# Patient Record
Sex: Female | Born: 1955 | Race: White | Hispanic: No | Marital: Married | Smoking: Never smoker
Health system: Southern US, Community
[De-identification: ages and names within clinical notes are randomized; demographics above are authoritative.]

## PROBLEM LIST (undated history)

## (undated) DIAGNOSIS — F209 Schizophrenia, unspecified: Secondary | ICD-10-CM

## (undated) DIAGNOSIS — I639 Cerebral infarction, unspecified: Secondary | ICD-10-CM

## (undated) DIAGNOSIS — I10 Essential (primary) hypertension: Secondary | ICD-10-CM

---

## 2000-08-14 ENCOUNTER — Other Ambulatory Visit: Admission: RE | Admit: 2000-08-14 | Discharge: 2000-08-14 | Payer: Self-pay | Admitting: Obstetrics and Gynecology

## 2000-08-20 ENCOUNTER — Encounter: Payer: Self-pay | Admitting: Obstetrics and Gynecology

## 2000-08-20 ENCOUNTER — Encounter: Admission: RE | Admit: 2000-08-20 | Discharge: 2000-08-20 | Payer: Self-pay | Admitting: Obstetrics and Gynecology

## 2000-08-20 ENCOUNTER — Other Ambulatory Visit: Admission: RE | Admit: 2000-08-20 | Discharge: 2000-08-20 | Payer: Self-pay | Admitting: Obstetrics and Gynecology

## 2000-11-21 ENCOUNTER — Inpatient Hospital Stay (HOSPITAL_COMMUNITY): Admission: EM | Admit: 2000-11-21 | Discharge: 2000-12-02 | Payer: Self-pay | Admitting: Psychiatry

## 2018-05-18 ENCOUNTER — Emergency Department (HOSPITAL_COMMUNITY): Payer: Medicare Other

## 2018-05-18 ENCOUNTER — Encounter: Payer: Self-pay | Admitting: Emergency Medicine

## 2018-05-18 ENCOUNTER — Inpatient Hospital Stay (HOSPITAL_COMMUNITY)
Admission: EM | Admit: 2018-05-18 | Discharge: 2018-05-27 | DRG: 092 | Disposition: A | Discharge status: Skilled Nursing Facility | Payer: Medicare Other | Attending: Internal Medicine | Admitting: Internal Medicine

## 2018-05-18 DIAGNOSIS — R402142 Coma scale, eyes open, spontaneous, at arrival to emergency department: Secondary | ICD-10-CM | POA: Diagnosis present

## 2018-05-18 DIAGNOSIS — R778 Other specified abnormalities of plasma proteins: Secondary | ICD-10-CM

## 2018-05-18 DIAGNOSIS — N179 Acute kidney failure, unspecified: Secondary | ICD-10-CM | POA: Diagnosis not present

## 2018-05-18 DIAGNOSIS — Z8249 Family history of ischemic heart disease and other diseases of the circulatory system: Secondary | ICD-10-CM | POA: Diagnosis not present

## 2018-05-18 DIAGNOSIS — R402252 Coma scale, best verbal response, oriented, at arrival to emergency department: Secondary | ICD-10-CM | POA: Diagnosis present

## 2018-05-18 DIAGNOSIS — I614 Nontraumatic intracerebral hemorrhage in cerebellum: Secondary | ICD-10-CM | POA: Diagnosis not present

## 2018-05-18 DIAGNOSIS — E860 Dehydration: Secondary | ICD-10-CM | POA: Diagnosis present

## 2018-05-18 DIAGNOSIS — R402362 Coma scale, best motor response, obeys commands, at arrival to emergency department: Secondary | ICD-10-CM | POA: Diagnosis present

## 2018-05-18 DIAGNOSIS — Z8673 Personal history of transient ischemic attack (TIA), and cerebral infarction without residual deficits: Secondary | ICD-10-CM | POA: Diagnosis not present

## 2018-05-18 DIAGNOSIS — R270 Ataxia, unspecified: Secondary | ICD-10-CM

## 2018-05-18 DIAGNOSIS — D1802 Hemangioma of intracranial structures: Secondary | ICD-10-CM | POA: Diagnosis present

## 2018-05-18 DIAGNOSIS — I619 Nontraumatic intracerebral hemorrhage, unspecified: Secondary | ICD-10-CM | POA: Diagnosis present

## 2018-05-18 DIAGNOSIS — R7989 Other specified abnormal findings of blood chemistry: Secondary | ICD-10-CM | POA: Diagnosis not present

## 2018-05-18 DIAGNOSIS — R29700 NIHSS score 0: Secondary | ICD-10-CM | POA: Diagnosis present

## 2018-05-18 DIAGNOSIS — I161 Hypertensive emergency: Secondary | ICD-10-CM | POA: Diagnosis present

## 2018-05-18 DIAGNOSIS — I160 Hypertensive urgency: Secondary | ICD-10-CM | POA: Diagnosis not present

## 2018-05-18 DIAGNOSIS — F209 Schizophrenia, unspecified: Secondary | ICD-10-CM | POA: Diagnosis present

## 2018-05-18 DIAGNOSIS — E876 Hypokalemia: Secondary | ICD-10-CM | POA: Diagnosis not present

## 2018-05-18 DIAGNOSIS — E785 Hyperlipidemia, unspecified: Secondary | ICD-10-CM | POA: Diagnosis present

## 2018-05-18 DIAGNOSIS — I10 Essential (primary) hypertension: Secondary | ICD-10-CM | POA: Diagnosis not present

## 2018-05-18 DIAGNOSIS — D72829 Elevated white blood cell count, unspecified: Secondary | ICD-10-CM | POA: Diagnosis not present

## 2018-05-18 DIAGNOSIS — Z008 Encounter for other general examination: Secondary | ICD-10-CM

## 2018-05-18 DIAGNOSIS — F99 Mental disorder, not otherwise specified: Secondary | ICD-10-CM | POA: Diagnosis not present

## 2018-05-18 DIAGNOSIS — I6523 Occlusion and stenosis of bilateral carotid arteries: Secondary | ICD-10-CM | POA: Diagnosis not present

## 2018-05-18 DIAGNOSIS — F29 Unspecified psychosis not due to a substance or known physiological condition: Secondary | ICD-10-CM | POA: Diagnosis not present

## 2018-05-18 HISTORY — DX: Schizophrenia, unspecified: F20.9

## 2018-05-18 LAB — COMPREHENSIVE METABOLIC PANEL
ALBUMIN: 4.4 g/dL (ref 3.5–5.0)
ALT: 18 U/L (ref 0–44)
ANION GAP: 9 (ref 5–15)
AST: 22 U/L (ref 15–41)
Alkaline Phosphatase: 99 U/L (ref 38–126)
BILIRUBIN TOTAL: 0.7 mg/dL (ref 0.3–1.2)
BUN: 14 mg/dL (ref 8–23)
CO2: 27 mmol/L (ref 22–32)
Calcium: 9.6 mg/dL (ref 8.9–10.3)
Chloride: 107 mmol/L (ref 98–111)
Creatinine, Ser: 1.04 mg/dL — ABNORMAL HIGH (ref 0.44–1.00)
GFR calc Af Amer: >60 mL/min (ref >60)
GFR calc non Af Amer: 56 mL/min — ABNORMAL LOW (ref >60)
GLUCOSE: 107 mg/dL — AB (ref 70–99)
POTASSIUM: 3.7 mmol/L (ref 3.5–5.1)
Sodium: 143 mmol/L (ref 135–145)
TOTAL PROTEIN: 8.2 g/dL — AB (ref 6.5–8.1)

## 2018-05-18 LAB — RAPID URINE DRUG SCREEN, HOSP PERFORMED
AMPHETAMINES: NOT DETECTED
BENZODIAZEPINES: NOT DETECTED
Barbiturates: NOT DETECTED
Cocaine: NOT DETECTED
Opiates: NOT DETECTED
Tetrahydrocannabinol: NOT DETECTED

## 2018-05-18 LAB — I-STAT TROPONIN, ED
TROPONIN I, POC: 0.16 ng/mL — AB (ref 0.00–0.08)
TROPONIN I, POC: 0.35 ng/mL — AB (ref 0.00–0.08)

## 2018-05-18 LAB — CBC
HCT: 49.3 % — ABNORMAL HIGH (ref 36.0–46.0)
Hemoglobin: 16.6 g/dL — ABNORMAL HIGH (ref 12.0–15.0)
MCH: 30.3 pg (ref 26.0–34.0)
MCHC: 33.7 g/dL (ref 30.0–36.0)
MCV: 90.1 fL (ref 80.0–100.0)
NRBC: 0 % (ref 0.0–0.2)
Platelets: 358 10*3/uL (ref 150–400)
RBC: 5.47 MIL/uL — ABNORMAL HIGH (ref 3.87–5.11)
RDW: 12.1 % (ref 11.5–15.5)
WBC: 10.9 10*3/uL — ABNORMAL HIGH (ref 4.0–10.5)

## 2018-05-18 LAB — URINALYSIS, ROUTINE W REFLEX MICROSCOPIC
Bilirubin Urine: NEGATIVE
Glucose, UA: NEGATIVE mg/dL
HGB URINE DIPSTICK: NEGATIVE
Ketones, ur: NEGATIVE mg/dL
Nitrite: NEGATIVE
Protein, ur: >=300 mg/dL — AB
SPECIFIC GRAVITY, URINE: 1.008 (ref 1.005–1.030)
pH: 8 (ref 5.0–8.0)

## 2018-05-18 LAB — APTT: aPTT: 31 seconds (ref 24–36)

## 2018-05-18 LAB — DIFFERENTIAL
Abs Immature Granulocytes: 0.03 10*3/uL (ref 0.00–0.07)
BASOS ABS: 0.1 10*3/uL (ref 0.0–0.1)
BASOS PCT: 1 %
EOS ABS: 0 10*3/uL (ref 0.0–0.5)
EOS PCT: 0 %
IMMATURE GRANULOCYTES: 0 %
Lymphocytes Relative: 14 %
Lymphs Abs: 1.5 10*3/uL (ref 0.7–4.0)
Monocytes Absolute: 0.4 10*3/uL (ref 0.1–1.0)
Monocytes Relative: 4 %
NEUTROS PCT: 81 %
Neutro Abs: 8.8 10*3/uL — ABNORMAL HIGH (ref 1.7–7.7)

## 2018-05-18 LAB — PROTIME-INR
INR: 0.87
Prothrombin Time: 11.8 seconds (ref 11.4–15.2)

## 2018-05-18 LAB — ETHANOL: Alcohol, Ethyl (B): <10 mg/dL (ref <10)

## 2018-05-18 MED ORDER — HYDRALAZINE HCL 20 MG/ML IJ SOLN
10.0000 mg | INTRAMUSCULAR | Status: DC | PRN
  Administered 2018-05-19 (×2): 20 mg via INTRAVENOUS
  Filled 2018-05-18 (×2): qty 1

## 2018-05-18 MED ORDER — SENNOSIDES-DOCUSATE SODIUM 8.6-50 MG PO TABS
1.0000 | ORAL_TABLET | Freq: Two times a day (BID) | ORAL | Status: DC
  Administered 2018-05-19 – 2018-05-27 (×15): 1 via ORAL
  Filled 2018-05-18 (×16): qty 1

## 2018-05-18 MED ORDER — LABETALOL HCL 5 MG/ML IV SOLN
10.0000 mg | INTRAVENOUS | Status: DC | PRN
  Administered 2018-05-18 – 2018-05-19 (×4): 10 mg via INTRAVENOUS
  Filled 2018-05-18 (×5): qty 4

## 2018-05-18 MED ORDER — ACETAMINOPHEN 160 MG/5ML PO SOLN: 650.0000 mg | ORAL | Status: DC | PRN

## 2018-05-18 MED ORDER — ACETAMINOPHEN 325 MG PO TABS
650.0000 mg | ORAL_TABLET | ORAL | Status: DC | PRN
  Administered 2018-05-24 – 2018-05-26 (×4): 650 mg via ORAL
  Filled 2018-05-18 (×4): qty 2

## 2018-05-18 MED ORDER — LABETALOL HCL 5 MG/ML IV SOLN
10.0000 mg | Freq: Once | INTRAVENOUS | Status: AC
  Administered 2018-05-18: 10 mg via INTRAVENOUS
  Filled 2018-05-18: qty 4

## 2018-05-18 MED ORDER — CLEVIDIPINE BUTYRATE 0.5 MG/ML IV EMUL
0.0000 mg/h | INTRAVENOUS | Status: DC
  Filled 2018-05-18: qty 50

## 2018-05-18 MED ORDER — STROKE: EARLY STAGES OF RECOVERY BOOK
Freq: Once | Status: AC
  Administered 2018-05-26: 15:00:00
  Filled 2018-05-18: qty 1

## 2018-05-18 MED ORDER — LORAZEPAM 2 MG/ML IJ SOLN
1.0000 mg | Freq: Once | INTRAMUSCULAR | Status: AC
  Administered 2018-05-18: 1 mg via INTRAVENOUS
  Filled 2018-05-18: qty 1

## 2018-05-18 MED ORDER — ACETAMINOPHEN 650 MG RE SUPP: 650.0000 mg | RECTAL | Status: DC | PRN

## 2018-05-18 NOTE — ED Notes (Signed)
Patient transported to MRI 

## 2018-05-18 NOTE — ED Notes (Signed)
RN made aware of elevated BP 

## 2018-05-18 NOTE — ED Provider Notes (Signed)
[facility] DEPT Provider Note   CSN: [id number] Arrival date & time: 05/18/18  1351     History   Chief Complaint No chief complaint on file.   HPI Linda Lynch is a 62 y.o. female with unknown PMH is brought to the ER for "help with everything"  Patient is oriented to self, place, time.  States that her speech sounds different, like she is on drugs but adamantly denies taking any medications or illicit drugs.  Additionally states that she has been having a hard time walking and has to hang onto the walls. States she is SOB when walking from her bedroom to the bathroom.  Other associated symptoms include headache, jaw pain, teeth pain.  She denies fever, CP, abdominal pain, vomiting, diarrhea, urinary symptoms.  Pt states she has a psychiatric history but cannot tell me what.  She has not taken medications in many years.  She denies SI, HI, auditory hallucinations but states she sees people that are not real walking around when she watches TV.   1530: Corroborating history obtained by TTS counselor who spoke to pt's elderly mother [phone].  Mother states she has noticed changes in speech and balance for the last 1 week.  She has staggering gait and appears to be unsteady.  She has seen pt going to the bathroom a lot.   HPI  Past Medical History:  Diagnosis Date  . Schizophrenia [facility])     Patient Active Problem List   Diagnosis Date Noted  . ICH (intracerebral hemorrhage) ([place]) 05/18/2018  . Hypertensive urgency 05/18/2018    History reviewed. No pertinent surgical history.   OB History   None      Home Medications    Prior to Admission medications   Not on File    Family History Family History  Problem Relation Age of Onset  . Hypertension Mother     Social History Social History   Tobacco Use  . Smoking status: Never Smoker  Substance Use Topics  . Alcohol use: Never    Frequency: Never  . Drug use: Never      Allergies   Patient has no known allergies.   Review of Systems Review of Systems  Respiratory: Positive for shortness of breath.   Neurological: Positive for headaches.       Speech changes, unsteady gait   All other systems reviewed and are negative.    Physical Exam Updated Vital Signs BP (!) 177/93 (BP Location: Left Arm)   Pulse 71   Temp 98.2 F (36.8 C) (Oral)   Resp 19   SpO2 96%   Physical Exam  Constitutional: She is oriented to person, place, and time. She appears well-developed and well-nourished. No distress.  NAD.  HENT:  Head: Normocephalic and atraumatic.  Right Ear: External ear normal.  Left Ear: External ear normal.  Nose: Nose normal.  MMM. Uvula slightly deviated to L without edema, erythema. Tonsils normal.   Eyes: Conjunctivae and EOM are normal.  Neck: Normal range of motion. Neck supple.  Cardiovascular: Normal rate, regular rhythm and normal heart sounds.  Pulmonary/Chest: Effort normal and breath sounds normal.  Abdominal: Soft. There is no tenderness.  Musculoskeletal: Normal range of motion. She exhibits no deformity.  Neurological: She is alert and oriented to person, place, and time. Gait abnormal.  Alert and oriented to self, place, time and event.  Speech is slowed without obvious dysarthria or aphasia. Strength 5/5 in upper and lower extremities  Sensation to light touch intact in bilateral face, upper and lower extremities Slow, unsteady gait, pt hangs on to walls. No pronator drift. No leg drop.  Normal finger-to-nose, finger tapping, heel to shin normal. CN II-XII grossly intact bilaterally.   Skin: Skin is warm and dry. Capillary refill takes less than 2 seconds.  Psychiatric: She has a normal mood and affect. Her behavior is normal. Judgment and thought content normal. Her speech is delayed.  Denies SI, HI, auditory hallucinations. States she sees "people walking around" that don't exist.   Nursing note and vitals  reviewed.    ED Treatments / Results  Labs (all labs ordered are listed, but only abnormal results are displayed) Labs Reviewed  CBC - Abnormal; Notable for the following components:      Result Value   WBC 10.9 (*)    RBC 5.47 (*)    Hemoglobin 16.6 (*)    HCT 49.3 (*)    All other components within normal limits  DIFFERENTIAL - Abnormal; Notable for the following components:   Neutro Abs 8.8 (*)    All other components within normal limits  COMPREHENSIVE METABOLIC PANEL - Abnormal; Notable for the following components:   Glucose, Bld 107 (*)    Creatinine, Ser 1.04 (*)    Total Protein 8.2 (*)    GFR calc non Af Amer 56 (*)    All other components within normal limits  URINALYSIS, ROUTINE W REFLEX MICROSCOPIC - Abnormal; Notable for the following components:   Color, Urine STRAW (*)    Protein, ur >=300 (*)    Leukocytes, UA SMALL (*)    Bacteria, UA RARE (*)    All other components within normal limits  I-STAT TROPONIN, ED - Abnormal; Notable for the following components:   Troponin i, poc 0.16 (*)    All other components within normal limits  I-STAT TROPONIN, ED - Abnormal; Notable for the following components:   Troponin i, poc 0.35 (*)    All other components within normal limits  ETHANOL  PROTIME-INR  APTT  RAPID URINE DRUG SCREEN, HOSP PERFORMED  TROPONIN I  TROPONIN I  TROPONIN I  HIV ANTIBODY (ROUTINE TESTING W REFLEX)    EKG EKG Interpretation  Date/Time:  Monday May 18 2018 17:02:07 EDT Ventricular Rate:  110 PR Interval:    QRS Duration: 141 QT Interval:  365 QTC Calculation: 494 R Axis:   72 Text Interpretation:  Sinus tachycardia Right bundle branch block agree. no old comparison Confirmed by Charlesetta Shanks [id number]) on 05/18/2018 5:13:58 PM   Radiology Dg Chest 1 View  Result Date: 05/18/2018 CLINICAL DATA:  Nodular density at the left lung base question nipple shadow. EXAM: CHEST  1 VIEW COMPARISON:  05/18/2018 chest radiograph at  1755 hours FINDINGS: Nipple markers have been placed and repeat imaging of the chest performed. Heart and mediastinal contours are within normal limits. No pulmonary consolidation, effusion or pneumothorax. No pulmonary edema. The nodular density seen previously at the left lung base is not visualized on current exam CT either having represented the left nipple shadow which now projects below the left hemidiaphragm or may have represented a summation of overlapping ribs and vessels. No discrete dominant mass is seen. No pulmonary consolidation or effusion. IMPRESSION: Nonvisualized nodular opacity previously seen at the left lung base laterally. Findings may have represented the left nipple shadow which now projects just below the left hemidiaphragm on current study or possibly a summation of overlapping ribs and vessels previously. Unfortunately,  to be absolutely certain that there is no pulmonary nodule being obscured by the diaphragm, CT of the chest without IV contrast would be the study of choice. Electronically Signed   By: Ashley Royalty M.D.   On: 05/18/2018 20:46   Dg Chest 2 View  Result Date: 05/18/2018 CLINICAL DATA:  Elevated troponin.  Shortness of breath. EXAM: CHEST - 2 VIEW COMPARISON:  None. FINDINGS: 1.2 cm nodular structure in the left lower chest is indeterminate. Otherwise, the lungs are clear. Heart and mediastinum are within normal limits. Trachea is midline. Negative for a pneumothorax. No large pleural effusions. No acute bone abnormality. IMPRESSION: No active cardiopulmonary disease. Indeterminate 1.2 cm nodular density in left lower chest. This could represent a nipple shadow. Recommend a follow-up chest radiograph with nipple markers. Electronically Signed   By: Markus Daft M.D.   On: 05/18/2018 18:07   Ct Head Wo Contrast  Result Date: 05/18/2018 CLINICAL DATA:  62 year old female with schizophrenia off medication. Initial encounter. EXAM: CT HEAD WITHOUT CONTRAST TECHNIQUE:  Contiguous axial images were obtained from the base of the skull through the vertex without intravenous contrast. COMPARISON:  None. FINDINGS: Brain: Small hyperdensity right cerebellum I suspect represents calcification rather than blood and may be related to remote insult such as remote small infarct. Cannot exclude underlying small cavernoma. 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 I suspect these are remote infarcts without CT evidence of large acute infarct. Prominent chronic microvascular changes Mild global atrophy. No intracranial mass lesion seen separate from above described findings. Vascular: No hyperdense vessel. Skull: Hyperostosis frontalis interna incidentally noted. Sinuses/Orbits: No acute orbital abnormality. Visualized paranasal sinuses are clear. Other: Mastoid air cells and middle ear cavities are clear. IMPRESSION: 1. Small hyperdensity right cerebellum I suspect represents calcification rather than blood and may be related to remote insult such as remote small infarct. Cannot exclude underlying small cavernoma. If there is any change in the patient's mental status, this can be re-assessed on follow-up to exclude the less likely consideration of intracranial hemorrhage. 2. 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 I suspect these are remote infarcts without CT evidence of large acute infarct. 3. Prominent chronic microvascular changes 4. Mild global atrophy. Electronically Signed   By: Genia Del M.D.   On: 05/18/2018 16:57   Mr Brain Wo Contrast  Result Date: 05/18/2018 CLINICAL DATA:  Initial evaluation for acute altered mental status, ataxia, slurred speech, headache. EXAM: MRI HEAD WITHOUT CONTRAST TECHNIQUE: Multiplanar, multiecho pulse sequences of the brain and surrounding structures were obtained without intravenous contrast. COMPARISON:  Prior CT from  earlier the same day. FINDINGS: Brain: Diffuse prominence of the CSF containing spaces compatible with generalized cerebral atrophy, advanced for age. Confluent T2/FLAIR hyperintensity within the periventricular and deep white matter both cerebral hemispheres, fairly advanced in nature, and most likely related to chronic microvascular ischemic change. Multiple remote lacunar infarcts present within the bilateral basal ganglia/corona radiata as well as the thalami and pons. Chronic hemosiderin staining noted about a right basal ganglia infarct. Small remote left cerebellar infarct noted. Few additional scattered chronic micro hemorrhages noted involving the supratentorial brain, likely related to hypertension and/or small vessel disease. 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 No associated edema to suggest acute hemorrhage. No abnormal foci of restricted diffusion to suggest acute or  subacute ischemia. Gray-white matter differentiation maintained. No acute intracranial hemorrhage. No other mass lesion. No midline shift or mass effect. No hydrocephalus. No extra-axial fluid collection. Pituitary gland within normal limits. Vascular: Major intracranial vascular flow voids are maintained. Skull and upper cervical spine: Craniocervical junction normal. Upper cervical spine within normal limits. Bone marrow signal intensity within normal limits. Scalp soft tissues unremarkable. Sinuses/Orbits: Globes and orbital soft tissues within normal limits. Mild mucosal thickening within the ethmoidal air cells. Paranasal sinuses are otherwise clear. No mastoid effusion. Other: None. IMPRESSION: 1. No acute intracranial abnormality. 2. Moderately advanced cerebral atrophy with chronic small vessel ischemic disease, with multiple remote lacunar infarcts involving the bilateral basal ganglia, thalami,  and pons. 3. 13 mm lesion with T2 hypointense rim involving the right cerebellar hemisphere, corresponding with previously noted hyperdensity. 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 No evidence associated acute or recent hemorrhage. 4. Few additional scattered chronic micro hemorrhages scattered throughout the brain, favored to be related to poorly controlled hypertension. Electronically Signed   By: Jeannine Boga M.D.   On: 05/18/2018 21:39    Procedures .Critical Care Performed by: Kinnie Feil, PA-C Authorized by: Kinnie Feil, PA-C   Critical care provider statement:    Critical care time (minutes):  45   Critical care was necessary to treat or prevent imminent or life-threatening deterioration of the following conditions: subacute ICH, elevated troponin.   Critical care was time spent personally by me on the following activities:  Discussions with consultants, evaluation of patient's response to treatment, examination of patient, ordering and performing treatments and interventions, ordering and review of laboratory studies, ordering and review of radiographic studies, pulse oximetry, re-evaluation of patient's condition, obtaining history from patient or surrogate and review of old charts   (including critical care time)  Medications Ordered in ED Medications  labetalol (NORMODYNE,TRANDATE) injection 10 mg (10 mg Intravenous Given 05/18/18 2227)  hydrALAZINE (APRESOLINE) injection 10-20 mg (has no administration in time range)   stroke: mapping our early stages of recovery book (has no administration in time range)  acetaminophen (TYLENOL) tablet 650 mg (has no administration in time range)    Or  acetaminophen (TYLENOL) solution 650 mg (has no administration in time range)    Or  acetaminophen (TYLENOL) suppository 650 mg (has no administration in time range)  senna-docusate (Senokot-S) tablet 1 tablet (has no administration in time range)  labetalol  (NORMODYNE,TRANDATE) injection 10 mg (10 mg Intravenous Given 05/18/18 1809)  LORazepam (ATIVAN) injection 1 mg (1 mg Intravenous Given 05/18/18 2038)     Initial Impression / Assessment and Plan / ED Course  I have reviewed the triage vital signs and the nursing notes.  Pertinent labs & imaging results that were available during my care of the patient were reviewed by me and considered in my medical decision making (see chart for details).  Clinical Course as of May 19 9  Mon May 18, 2018  1736 Troponin i, poc(!!): 0.16 [CG]  1737 BP(!): 195/120 [CG]  1737 BP(!): 180/100 [CG]  1737 Hemoglobin(!): 16.6 [CG]  1737 HCT(!): 49.3 [CG]  1738 IMPRESSION: 1. Small hyperdensity right cerebellum I suspect represents calcification rather than blood and may be related to remote insult such as remote small infarct. Cannot exclude underlying small cavernoma. If there is any change in the patient's mental status, this can be re-assessed on follow-up to exclude the less likely consideration of intracranial hemorrhage. 2. Bilateral basal ganglia, bilateral thalamic, left paracentral pontine and left  corona radiata infarcts. I suspect these are remote infarcts without CT evidence of large acute infarct. 3. Prominent chronic microvascular changes 4. Mild global atrophy.  CT HEAD WO CONTRAST [CG]  1800 Creatinine(!): 1.04 [CG]  2103 IMPRESSION: Nonvisualized nodular opacity previously seen at the left lung base laterally. Findings may have represented the left nipple shadow which now projects just below the left hemidiaphragm on current study or possibly a summation of overlapping ribs and vessels previously. Unfortunately, to be absolutely certain that there is no pulmonary nodule being obscured by the diaphragm, CT of the chest without IV contrast would be the study of choice.    DG Chest 1 View [CG]  2111 Troponin i, poc(!!): 0.35 [CG]  2121 No ICU, admit cone. BP 174 systolic 10 mg labetalol  prn q5 max 300 mg 24 hr, hold HR 65. Consider hydralazine.    [CG]  2201 IMPRESSION: 1. No acute intracranial abnormality. 2. Moderately advanced cerebral atrophy with chronic small vessel ischemic disease, with multiple remote lacunar infarcts involving the bilateral basal ganglia, thalami, and pons. 3. 13 mm lesion with T2 hypointense rim involving the right cerebellar hemisphere, corresponding with previously noted hyperdensity. 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 No evidence associated acute or recent hemorrhage. 4. Few additional scattered chronic micro hemorrhages scattered throughout the brain, favored to be related to poorly controlled hypertension.    MR BRAIN WO CONTRAST [CG]    Clinical Course User Index [CG] Kinnie Feil, PA-C    Pt and mother report unsteady gait, ataxia, slowed speech x 1 week in setting of headaches.  H/o schizophrenia (pt denies) w/o psychotropic medications for years.  Pt is a poor historian, mother is elderly and history is also limited.  Duration and chronicity is hard to determine. Exam shows slow, unsteady gait. Speech is slowed but not necessarily dysarthric or with aphasia.  Concern for tardive dyskinsia vs stroke vs psych. Pt was informally evaluated by TTS counselor who recommends medical work up since she has no active SI, HI will need to r/o medical etiologies.  Consider formal TTS assessment. Will initiate lab work, CT head, UDS, reassess.    1740: CT head with possible remote cerebellar infarct less likely intracranial bleed and other small remote infarcts.  Trop 0.16, pt did report exertional SOB with ambulating from bedroom to bathroom.  Given these, will obtain MRI brain to r/o infarct. Pending CXR, CMP. Hypertensive 195/120>180/100, will doste labetalol.   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  Trop trending upwards.  BP poorly controlled in ER. Spoke to neurology Leonel Ramsay)  who suspects these are subacute infarcts, pt does not need ICU, recommend SDU at [facility].  Spoke to Dr. Alcario Drought who will admit. Case discussed with Dr. Johnney Killian.   Final Clinical Impressions(s) / ED Diagnoses   Final diagnoses:  Ataxia  Elevated troponin    ED Discharge Orders    None       Kinnie Feil, PA-C 05/19/18 0010    Charlesetta Shanks, MD 05/21/18 [phone]

## 2018-05-18 NOTE — H&P (Signed)
History and Physical    Linda Lynch WNU:[id number] DOB: 1955/11/16 DOA: 05/18/2018  PCP: Patient, No Pcp Per  Patient coming from: Home  I have personally briefly reviewed patient's old medical records in [place]  Chief Complaint: Psychiatric evaluation  HPI: Linda Lynch is a 62 y.o. female with medical history significant of schizophrenia.  Patient initially triaged to psychiatric evaluation based on arrival by [facility] after family called and said she was schizophrenic and has been off medications.  Reportedly when patient got here she stated she was here for "personal reasons" and pointed to her genital area.  On further history taking with mother (who is 23 years old), it seems that patient has been "staggering" with unsteady gait for the past week.  The patient though reports that this has been ongoing for "15-20 years".  Patient denies headache.  Denies chest pain.  Does have some SOB.  Patient denies any PMH, states mother has HTN.  Patient doesn't seem like the most reliable historian.   ED Course: Serial trops were slightly positive x2 and slightly up trending.  BP is consistently very elevated with systolic's consistently above 190.  Alarmingly given this and the ataxia presentation the CT of her head suggests possible subacute cerebellar ICH.   Review of Systems: As per HPI otherwise 10 point review of systems negative.   Past Medical History:  Diagnosis Date  . Schizophrenia ([place])     History reviewed. No pertinent surgical history.   reports that she has never smoked. She does not have any smokeless tobacco history on file. She reports that she does not drink alcohol or use drugs.  No Known Allergies  Family History  Problem Relation Age of Onset  . Hypertension Mother      Prior to Admission medications   Not on File    Physical Exam: Vitals:   05/18/18 1356 05/18/18 1423 05/18/18 1801 05/18/18 2136  BP: (!) 195/120 (!) 180/100 (!) 182/112  (!) 195/104  Pulse: (!) 118 100 (!) 109 97  Resp: 14 16 (!) 21 20  Temp: 98.5 F (36.9 C) 98 F (36.7 C)  98.2 F (36.8 C)  TempSrc: Oral Oral  Oral  SpO2: 100% 100% 97% 94%    Constitutional: NAD, calm, comfortable Eyes: PERRL, lids and conjunctivae normal ENMT: Mucous membranes are moist. Posterior pharynx clear of any exudate or lesions.Normal dentition.  Neck: normal, supple, no masses, no thyromegaly Respiratory: clear to auscultation bilaterally, no wheezing, no crackles. Normal respiratory effort. No accessory muscle use.  Cardiovascular: Regular rate and rhythm, no murmurs / rubs / gallops. No extremity edema. 2+ pedal pulses. No carotid bruits.  Abdomen: no tenderness, no masses palpated. No hepatosplenomegaly. Bowel sounds positive.  Musculoskeletal: no clubbing / cyanosis. No joint deformity upper and lower extremities. Good ROM, no contractures. Normal muscle tone.  Skin: no rashes, lesions, ulcers. No induration Neurologic: CN 2-12 grossly intact. Sensation intact, DTR normal. Strength 5/5 in all 4. Gait unsteady. Psychiatric: Denies SI, HI, states she sees "people walking around" that don't exist.   Labs on Admission: I have personally reviewed following labs and imaging studies  CBC: Recent Labs  Lab 05/18/18 1716  WBC 10.9*  NEUTROABS 8.8*  HGB 16.6*  HCT 49.3*  MCV 90.1  PLT 034   Basic Metabolic Panel: Recent Labs  Lab 05/18/18 1716  NA 143  K 3.7  CL 107  CO2 27  GLUCOSE 107*  BUN 14  CREATININE 1.04*  CALCIUM 9.6  GFR: CrCl cannot be calculated (Unknown ideal weight.). Liver Function Tests: Recent Labs  Lab 05/18/18 1716  AST 22  ALT 18  ALKPHOS 99  BILITOT 0.7  PROT 8.2*  ALBUMIN 4.4   No results for input(s): LIPASE, AMYLASE in the last 168 hours. No results for input(s): AMMONIA in the last 168 hours. Coagulation Profile: Recent Labs  Lab 05/18/18 1716  INR 0.87   Cardiac Enzymes: No results for input(s): CKTOTAL, CKMB,  CKMBINDEX, TROPONINI in the last 168 hours. BNP (last 3 results) No results for input(s): PROBNP in the last 8760 hours. HbA1C: No results for input(s): HGBA1C in the last 72 hours. CBG: No results for input(s): GLUCAP in the last 168 hours. Lipid Profile: No results for input(s): CHOL, HDL, LDLCALC, TRIG, CHOLHDL, LDLDIRECT in the last 72 hours. Thyroid Function Tests: No results for input(s): TSH, T4TOTAL, FREET4, T3FREE, THYROIDAB in the last 72 hours. Anemia Panel: No results for input(s): VITAMINB12, FOLATE, FERRITIN, TIBC, IRON, RETICCTPCT in the last 72 hours. Urine analysis:    Component Value Date/Time   COLORURINE STRAW (A) 05/18/2018 1613   APPEARANCEUR CLEAR 05/18/2018 1613   LABSPEC 1.008 05/18/2018 1613   PHURINE 8.0 05/18/2018 1613   GLUCOSEU NEGATIVE 05/18/2018 1613   HGBUR NEGATIVE 05/18/2018 1613   BILIRUBINUR NEGATIVE 05/18/2018 1613   KETONESUR NEGATIVE 05/18/2018 1613   PROTEINUR >=300 (A) 05/18/2018 1613   NITRITE NEGATIVE 05/18/2018 1613   LEUKOCYTESUR SMALL (A) 05/18/2018 1613    Radiological Exams on Admission: Dg Chest 1 View  Result Date: 05/18/2018 CLINICAL DATA:  Nodular density at the left lung base question nipple shadow. EXAM: CHEST  1 VIEW COMPARISON:  05/18/2018 chest radiograph at 1755 hours FINDINGS: Nipple markers have been placed and repeat imaging of the chest performed. Heart and mediastinal contours are within normal limits. No pulmonary consolidation, effusion or pneumothorax. No pulmonary edema. The nodular density seen previously at the left lung base is not visualized on current exam CT either having represented the left nipple shadow which now projects below the left hemidiaphragm or may have represented a summation of overlapping ribs and vessels. No discrete dominant mass is seen. No pulmonary consolidation or effusion. IMPRESSION: Nonvisualized nodular opacity previously seen at the left lung base laterally. Findings may have  represented the left nipple shadow which now projects just below the left hemidiaphragm on current study or possibly a summation of overlapping ribs and vessels previously. Unfortunately, to be absolutely certain that there is no pulmonary nodule being obscured by the diaphragm, CT of the chest without IV contrast would be the study of choice. Electronically Signed   By: Ashley Royalty M.D.   On: 05/18/2018 20:46   Dg Chest 2 View  Result Date: 05/18/2018 CLINICAL DATA:  Elevated troponin.  Shortness of breath. EXAM: CHEST - 2 VIEW COMPARISON:  None. FINDINGS: 1.2 cm nodular structure in the left lower chest is indeterminate. Otherwise, the lungs are clear. Heart and mediastinum are within normal limits. Trachea is midline. Negative for a pneumothorax. No large pleural effusions. No acute bone abnormality. IMPRESSION: No active cardiopulmonary disease. Indeterminate 1.2 cm nodular density in left lower chest. This could represent a nipple shadow. Recommend a follow-up chest radiograph with nipple markers. Electronically Signed   By: Markus Daft M.D.   On: 05/18/2018 18:07   Ct Head Wo Contrast  Result Date: 05/18/2018 CLINICAL DATA:  62 year old female with schizophrenia off medication. Initial encounter. EXAM: CT HEAD WITHOUT CONTRAST TECHNIQUE: Contiguous axial images were obtained  from the base of the skull through the vertex without intravenous contrast. COMPARISON:  None. FINDINGS: Brain: Small hyperdensity right cerebellum I suspect represents calcification rather than blood and may be related to remote insult such as remote small infarct. Cannot exclude underlying small cavernoma. 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 I suspect these are remote infarcts without CT evidence of large acute infarct. Prominent chronic microvascular changes Mild global atrophy. No intracranial mass lesion seen separate from above described findings. Vascular:  No hyperdense vessel. Skull: Hyperostosis frontalis interna incidentally noted. Sinuses/Orbits: No acute orbital abnormality. Visualized paranasal sinuses are clear. Other: Mastoid air cells and middle ear cavities are clear. IMPRESSION: 1. Small hyperdensity right cerebellum I suspect represents calcification rather than blood and may be related to remote insult such as remote small infarct. Cannot exclude underlying small cavernoma. If there is any change in the patient's mental status, this can be re-assessed on follow-up to exclude the less likely consideration of intracranial hemorrhage. 2. 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 I suspect these are remote infarcts without CT evidence of large acute infarct. 3. Prominent chronic microvascular changes 4. Mild global atrophy. Electronically Signed   By: Genia Del M.D.   On: 05/18/2018 16:57   Mr Brain Wo Contrast  Result Date: 05/18/2018 CLINICAL DATA:  Initial evaluation for acute altered mental status, ataxia, slurred speech, headache. EXAM: MRI HEAD WITHOUT CONTRAST TECHNIQUE: Multiplanar, multiecho pulse sequences of the brain and surrounding structures were obtained without intravenous contrast. COMPARISON:  Prior CT from earlier the same day. FINDINGS: Brain: Diffuse prominence of the CSF containing spaces compatible with generalized cerebral atrophy, advanced for age. Confluent T2/FLAIR hyperintensity within the periventricular and deep white matter both cerebral hemispheres, fairly advanced in nature, and most likely related to chronic microvascular ischemic change. Multiple remote lacunar infarcts present within the bilateral basal ganglia/corona radiata as well as the thalami and pons. Chronic hemosiderin staining noted about a right basal ganglia infarct. Small remote left cerebellar infarct noted. Few additional scattered chronic micro hemorrhages noted involving the  supratentorial brain, likely related to hypertension and/or small vessel disease. 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 No associated edema to suggest acute hemorrhage. No abnormal foci of restricted diffusion to suggest acute or subacute ischemia. Gray-white matter differentiation maintained. No acute intracranial hemorrhage. No other mass lesion. No midline shift or mass effect. No hydrocephalus. No extra-axial fluid collection. Pituitary gland within normal limits. Vascular: Major intracranial vascular flow voids are maintained. Skull and upper cervical spine: Craniocervical junction normal. Upper cervical spine within normal limits. Bone marrow signal intensity within normal limits. Scalp soft tissues unremarkable. Sinuses/Orbits: Globes and orbital soft tissues within normal limits. Mild mucosal thickening within the ethmoidal air cells. Paranasal sinuses are otherwise clear. No mastoid effusion. Other: None. IMPRESSION: 1. No acute intracranial abnormality. 2. Moderately advanced cerebral atrophy with chronic small vessel ischemic disease, with multiple remote lacunar infarcts involving the bilateral basal ganglia, thalami, and pons. 3. 13 mm lesion with T2 hypointense rim involving the right cerebellar hemisphere, corresponding with previously noted hyperdensity. 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 No evidence associated acute or recent hemorrhage. 4. Few additional scattered chronic micro hemorrhages scattered throughout the brain, favored to be related to poorly controlled hypertension. Electronically Signed   By: Pincus Badder.D.  On: 05/18/2018 21:39    EKG: Independently reviewed.  Assessment/Plan Principal Problem:   ICH (intracerebral hemorrhage) (HCC) Active Problems:   Hypertensive urgency    1. Question subacute ICH - 62 yo F here  with AMS (possibly psychiatric in nature), gait ataxia (possibly chronic).  Despite the questionable HPI she does have profound HTN today with SBPs consistently in the 190s, serial trops of 0.16 and 0.35, and CT scan shows questionable cerebellar hemorrhage. 1. Per Dr. Leonel Ramsay 1. Likely subacute ICH 2. No need for clividipine gtt 3. Instead goal SBP < 160 4. Using labetalol 5. Hydralazine if that fails 2. Admit to SDU at [facility] 3. ICH pathway 4. Serial trops 5. Tele monitor 2. Schizophrenia off meds by history - 1. Psych consult once medically cleared  DVT prophylaxis: SCDs Code Status: Full Family Communication: No family in room Disposition Plan: TBD Consults called: Neuro hospitalist Admission status: Admit to inpatient  Severity of Illness: The appropriate patient status for this patient is INPATIENT. Inpatient status is judged to be reasonable and necessary in order to provide the required intensity of service to ensure the patient's safety. The patient's presenting symptoms, physical exam findings, and initial radiographic and laboratory data in the context of their chronic comorbidities is felt to place them at high risk for further clinical deterioration. Furthermore, it is not anticipated that the patient will be medically stable for discharge from the hospital within 2 midnights of admission. The following factors support the patient status of inpatient.   " The patient's presenting symptoms include Gait ataxia, AMS. " The worrisome physical exam findings include Gait ataxia, severe hypertension with SBP consistently > 190 " The initial radiographic and laboratory data are worrisome because of Possible subacute ICH in cerebellum, elevated and up trending troponin. " The chronic co-morbidities include history of schizophrenia.   * I certify that at the point of admission it is my clinical judgment that the patient will require inpatient hospital care spanning beyond 2 midnights  from the point of admission due to high intensity of service, high risk for further deterioration and high frequency of surveillance required.Etta Quill DO [facility] Pager [phone] Only works nights!  If 7AM-7PM, please contact the primary day team physician taking care of patient  [email or web address] Password [email or web address]  05/18/2018, 10:36 PM

## 2018-05-18 NOTE — ED Notes (Signed)
Alcario Drought MD Hospitalist is at bedside

## 2018-05-18 NOTE — ED Provider Notes (Signed)
Medical screening examination/treatment/procedure(s) were conducted as a shared visit with non-physician practitioner(s) and myself.  I personally evaluated the patient during the encounter.  EKG Interpretation  Date/Time:  Monday May 18 2018 17:02:07 EDT Ventricular Rate:  110 PR Interval:    QRS Duration: 141 QT Interval:  365 QTC Calculation: 494 R Axis:   72 Text Interpretation:  Sinus tachycardia Right bundle branch block agree. no old comparison Confirmed by Charlesetta Shanks [phone]) on 05/18/2018 8:67:54 PM  Has complicated presentation.  Patient initially triaged to psychiatric evaluation based on arrival by [facility] after family called and said she was schizophrenic and has been off medications.  Reportedly when patient got here she stated she was here for "personal reasons" and pointed to her genital area.  After further evaluation and identification of significant hypertension, patient evaluation was focused on medical etiologies.  Patient is a significantly difficult historian.  She tells me her speech has been kind of slurred and has not sounded right, she reports this is been in for "years".  She also reports that her balance has been off for a very long time and she has been trying to use her mother's walker.  Again she identified this is being a very long time.  When asked if she had a headache she reports that she "used to" have headaches but does not have any now.  History obtained from a phone call by TTS counselor to the patient's elderly mother, indicates that the symptoms have become pronounced over the past week.  She does identify shortness of breath is a complaint.  She is states now just walking short distances her house makes her very short of breath.  She denies chest pain.  Patient is alert and pleasant.  She is interactive.  Her speech has a slightly slurred quality although this seems potentially a chronic speech pattern.  Pupils equally round reactive to light  extraocular motions intact.  Heart regular no rub murmur gallop.  Lungs are clear.  Abdomen soft nontender.  No lower extremity edema.  Calves soft and nontender.  Finger-nose exam normal.  No pronator drift.  Patient was not ambulated.   I agree with plan of management.    Charlesetta Shanks, MD 05/30/18 [facility]

## 2018-05-18 NOTE — ED Triage Notes (Addendum)
Pt arrived voluntarily with [facility] after family called and said she had schizophrenia and has been off her meds..  Pt states she is here for "personal reasons" as she points to her vagina.

## 2018-05-18 NOTE — ED Notes (Signed)
Bed: WA02 Expected date:  Expected time:  Means of arrival:  Comments: TCU 30

## 2018-05-19 ENCOUNTER — Inpatient Hospital Stay (HOSPITAL_COMMUNITY): Payer: Medicare Other

## 2018-05-19 DIAGNOSIS — I614 Nontraumatic intracerebral hemorrhage in cerebellum: Secondary | ICD-10-CM

## 2018-05-19 DIAGNOSIS — R270 Ataxia, unspecified: Secondary | ICD-10-CM

## 2018-05-19 DIAGNOSIS — I5030 Unspecified diastolic (congestive) heart failure: Secondary | ICD-10-CM

## 2018-05-19 LAB — ECHOCARDIOGRAM COMPLETE
Height: 64 in
Weight: 2726.65 oz

## 2018-05-19 LAB — LIPID PANEL
CHOL/HDL RATIO: 4.5 ratio
Cholesterol: 195 mg/dL (ref 0–200)
HDL: 43 mg/dL (ref >40)
LDL Cholesterol: 136 mg/dL — ABNORMAL HIGH (ref 0–99)
TRIGLYCERIDES: 81 mg/dL (ref <150)
VLDL: 16 mg/dL (ref 0–40)

## 2018-05-19 LAB — MRSA PCR SCREENING: MRSA by PCR: NEGATIVE

## 2018-05-19 LAB — HEMOGLOBIN A1C
Hgb A1c MFr Bld: 5 % (ref 4.8–5.6)
Mean Plasma Glucose: 96.8 mg/dL

## 2018-05-19 MED ORDER — INFLUENZA VAC SPLIT QUAD 0.5 ML IM SUSY: 0.5000 mL | PREFILLED_SYRINGE | INTRAMUSCULAR | Status: DC

## 2018-05-19 MED ORDER — ASPIRIN EC 81 MG PO TBEC
81.0000 mg | DELAYED_RELEASE_TABLET | Freq: Every day | ORAL | Status: DC
  Administered 2018-05-19 – 2018-05-26 (×7): 81 mg via ORAL
  Filled 2018-05-19 (×9): qty 1

## 2018-05-19 MED ORDER — AMLODIPINE BESYLATE 10 MG PO TABS
10.0000 mg | ORAL_TABLET | Freq: Every day | ORAL | Status: DC
  Administered 2018-05-19 – 2018-05-27 (×8): 10 mg via ORAL
  Filled 2018-05-19 (×9): qty 1

## 2018-05-19 MED ORDER — HYDROCHLOROTHIAZIDE 25 MG PO TABS
25.0000 mg | ORAL_TABLET | Freq: Every day | ORAL | Status: DC
  Administered 2018-05-19: 25 mg via ORAL
  Filled 2018-05-19 (×2): qty 1

## 2018-05-19 MED ORDER — ONDANSETRON HCL 4 MG/2ML IJ SOLN
4.0000 mg | Freq: Four times a day (QID) | INTRAMUSCULAR | Status: DC | PRN
  Administered 2018-05-19: 4 mg via INTRAVENOUS
  Filled 2018-05-19: qty 2

## 2018-05-19 MED ORDER — HYDRALAZINE HCL 20 MG/ML IJ SOLN
10.0000 mg | INTRAMUSCULAR | Status: DC | PRN
  Administered 2018-05-20: 20 mg via INTRAVENOUS
  Administered 2018-05-20: 10 mg via INTRAVENOUS
  Administered 2018-05-20: 20 mg via INTRAVENOUS
  Administered 2018-05-20 – 2018-05-24 (×3): 10 mg via INTRAVENOUS
  Administered 2018-05-25: 20 mg via INTRAVENOUS
  Administered 2018-05-26: 10 mg via INTRAVENOUS
  Filled 2018-05-19 (×7): qty 1

## 2018-05-19 MED ORDER — IOPAMIDOL (ISOVUE-370) INJECTION 76%
INTRAVENOUS | Status: AC
  Filled 2018-05-19: qty 50

## 2018-05-19 MED ORDER — ATORVASTATIN CALCIUM 10 MG PO TABS
20.0000 mg | ORAL_TABLET | Freq: Every day | ORAL | Status: DC
  Administered 2018-05-21 – 2018-05-26 (×6): 20 mg via ORAL
  Filled 2018-05-19 (×7): qty 2

## 2018-05-19 NOTE — Progress Notes (Signed)
PROGRESS NOTE    Linda Lynch  IZT:[id number] DOB: 05-Sep-1955 DOA: 05/18/2018 PCP: Patient, No Pcp Per   Brief Narrative:  62 year old with past medical history relevant for psychiatric illness who came in with nonspecific complaints such as "help with everything" with reports from patient's elderly mother about problems with speech and balance and found to have hypertensive emergency with elevated troponins.  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   Assessment & Plan:   Principal Problem:   ICH (intracerebral hemorrhage) (HCC) Active Problems:   Hypertensive urgency   #) Gait instability/dysarthria: At this time it is unclear what symptoms she was having is patient's mother is not picking up the phone and the patient herself is an extremely poor historian.  She is extremely unreliable often going off on tangents and her review of systems is pan positive.  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 -Physical therapy consult  #) HHypertensive emergency/elevated troponin: Patient was noted to be quite hypertensive with systolics in the 382N.  Unclear etiology however this is now being controlled with PRN IV boluses as well as now oral medications.  She has not been on any antihypertensives at home.  She does report a 10-year history of chest pain that is unchanged.  It is not clear that she is symptomatic from this elevated troponin.  Regardless her EKG does not show any evidence of ACS. -Telemetry -Trend troponins -Echo ordered -We will hold on IV heparin at this time -Continue PRN  hydralazine for systolics greater than 053 -Start amlodipine 10 mg daily - Start HCTZ 25 mg daily  #) Lacunar  infarcts/microhemorrhages: Noted on CT scan scan and MRI.  These all appear to be old.  She has no sequelae from this that are evident.  There are likely secondary to uncontrolled hypertension. -Neurology has signed off -Continue aspirin 81 mg e- tight blood pressure control per above -Pending carotid ultrasound per neurology  #) Psychiatric history: Patient apparently has a psychiatric history however no confided it can be found to discuss the situation with her right now. 9 psychiatry consult  Fluids: Tolerating p.o. Electrodes: Monitor and supplement   nutrition: Regular diet  Prophylaxis: SCDs  Disposition: Pending stabilization of blood pressure and echo  Full code      Consultants:   Neurology  Procedures:   05/19/2017 echo: Pending  10 15,019 carotid ultrasound: Pending  Antimicrobials:   None   Subjective: Patient reports that she has had chest pain that is exertional for approximately 10 years.  She also reports syncope/presyncope, shortness of breath orthopnea, lower extremity edema, abdominal pain, cough, congestion, focal weakness that alternates, dysarthria.  Essentially her review of systems is pan positive.  She denies any SI or HI at this time.  Objective: Vitals:   05/19/18 0830 05/19/18 0847 05/19/18 0900 05/19/18 1000  BP: (!) 183/165 (!) 159/79 (!) 142/66 (!) 142/77  Pulse: 97  83 95  Resp: 18 20 (!) 22 20  Temp:      TempSrc:      SpO2: 98%  98% 97%  Weight:      Height:        Intake/Output Summary (Last 24 hours) at 05/19/2018 1201 Last data filed at 05/19/2018 1100 Gross  per 24 hour  Intake 600 ml  Output 450 ml  Net 150 ml   Filed Weights   05/19/18 0600  Weight: 77.3 kg    Examination:  General exam: Appears calm and comfortable  Respiratory system: Clear to auscultation. Respiratory effort normal. Cardiovascular system: Regular rate and rhythm, no murmurs. Gastrointestinal system: Abdomen is nondistended, soft and  nontender. No organomegaly or masses felt. Normal bowel sounds heard. Central nervous system: Alert but not oriented to situation, grossly intact, moving all extremities, cranial nerves II through XII intact, no dysdiadochokinesia. Extremities: Trace lower extremity edema Skin: No rashes over visible skin Psychiatry: Judgment and insight are impaired, patient is having delusions, unreliable narrator    Data Reviewed: I have personally reviewed following labs and imaging studies  CBC: Recent Labs  Lab 05/18/18 1716  WBC 10.9*  NEUTROABS 8.8*  HGB 16.6*  HCT 49.3*  MCV 90.1  PLT 347   Basic Metabolic Panel: Recent Labs  Lab 05/18/18 1716  NA 143  K 3.7  CL 107  CO2 27  GLUCOSE 107*  BUN 14  CREATININE 1.04*  CALCIUM 9.6   GFR: Estimated Creatinine Clearance: 55 mL/min (A) (by C-G formula based on SCr of 1.04 mg/dL (H)). Liver Function Tests: Recent Labs  Lab 05/18/18 1716  AST 22  ALT 18  ALKPHOS 99  BILITOT 0.7  PROT 8.2*  ALBUMIN 4.4   No results for input(s): LIPASE, AMYLASE in the last 168 hours. No results for input(s): AMMONIA in the last 168 hours. Coagulation Profile: Recent Labs  Lab 05/18/18 1716  INR 0.87   Cardiac Enzymes: No results for input(s): CKTOTAL, CKMB, CKMBINDEX, TROPONINI in the last 168 hours. BNP (last 3 results) No results for input(s): PROBNP in the last 8760 hours. HbA1C: No results for input(s): HGBA1C in the last 72 hours. CBG: No results for input(s): GLUCAP in the last 168 hours. Lipid Profile: No results for input(s): CHOL, HDL, LDLCALC, TRIG, CHOLHDL, LDLDIRECT in the last 72 hours. Thyroid Function Tests: No results for input(s): TSH, T4TOTAL, FREET4, T3FREE, THYROIDAB in the last 72 hours. Anemia Panel: No results for input(s): VITAMINB12, FOLATE, FERRITIN, TIBC, IRON, RETICCTPCT in the last 72 hours. Sepsis Labs: No results for input(s): PROCALCITON, LATICACIDVEN in the last 168 hours.  Recent Results (from the  past 240 hour(s))  MRSA PCR Screening     Status: None   Collection Time: 05/19/18  6:07 AM  Result Value Ref Range Status   MRSA by PCR NEGATIVE NEGATIVE Final    Comment:        The GeneXpert MRSA Assay (FDA approved for NASAL specimens only), is one component of a comprehensive MRSA colonization surveillance program. It is not intended to diagnose MRSA infection nor to guide or monitor treatment for MRSA infections. Performed at [facility], [place] [address], [place], [place] [address]          Radiology Studies: Dg Chest 1 View  Result Date: 05/18/2018 CLINICAL DATA:  Nodular density at the left lung base question nipple shadow. EXAM: CHEST  1 VIEW COMPARISON:  05/18/2018 chest radiograph at 1755 hours FINDINGS: Nipple markers have been placed and repeat imaging of the chest performed. Heart and mediastinal contours are within normal limits. No pulmonary consolidation, effusion or pneumothorax. No pulmonary edema. The nodular density seen previously at the left lung base is not visualized on current exam CT either having represented the left nipple shadow which now projects below the left hemidiaphragm or may  have represented a summation of overlapping ribs and vessels. No discrete dominant mass is seen. No pulmonary consolidation or effusion. IMPRESSION: Nonvisualized nodular opacity previously seen at the left lung base laterally. Findings may have represented the left nipple shadow which now projects just below the left hemidiaphragm on current study or possibly a summation of overlapping ribs and vessels previously. Unfortunately, to be absolutely certain that there is no pulmonary nodule being obscured by the diaphragm, CT of the chest without IV contrast would be the study of choice. Electronically Signed   By: Ashley Royalty M.D.   On: 05/18/2018 20:46   Dg Chest 2 View  Result Date: 05/18/2018 CLINICAL DATA:  Elevated troponin.  Shortness of breath. EXAM: CHEST - 2 VIEW  COMPARISON:  None. FINDINGS: 1.2 cm nodular structure in the left lower chest is indeterminate. Otherwise, the lungs are clear. Heart and mediastinum are within normal limits. Trachea is midline. Negative for a pneumothorax. No large pleural effusions. No acute bone abnormality. IMPRESSION: No active cardiopulmonary disease. Indeterminate 1.2 cm nodular density in left lower chest. This could represent a nipple shadow. Recommend a follow-up chest radiograph with nipple markers. Electronically Signed   By: Markus Daft M.D.   On: 05/18/2018 18:07   Ct Head Wo Contrast  Result Date: 05/18/2018 CLINICAL DATA:  62 year old female with schizophrenia off medication. Initial encounter. EXAM: CT HEAD WITHOUT CONTRAST TECHNIQUE: Contiguous axial images were obtained from the base of the skull through the vertex without intravenous contrast. COMPARISON:  None. FINDINGS: Brain: Small hyperdensity right cerebellum I suspect represents calcification rather than blood and may be related to remote insult such as remote small infarct. Cannot exclude underlying small cavernoma. 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 I suspect these are remote infarcts without CT evidence of large acute infarct. Prominent chronic microvascular changes Mild global atrophy. No intracranial mass lesion seen separate from above described findings. Vascular: No hyperdense vessel. Skull: Hyperostosis frontalis interna incidentally noted. Sinuses/Orbits: No acute orbital abnormality. Visualized paranasal sinuses are clear. Other: Mastoid air cells and middle ear cavities are clear. IMPRESSION: 1. Small hyperdensity right cerebellum I suspect represents calcification rather than blood and may be related to remote insult such as remote small infarct. Cannot exclude underlying small cavernoma. If there is any change in the patient's mental status, this can be re-assessed on follow-up to  exclude the less likely consideration of intracranial hemorrhage. 2. 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 I suspect these are remote infarcts without CT evidence of large acute infarct. 3. Prominent chronic microvascular changes 4. Mild global atrophy. Electronically Signed   By: Genia Del M.D.   On: 05/18/2018 16:57   Mr Brain Wo Contrast  Result Date: 05/18/2018 CLINICAL DATA:  Initial evaluation for acute altered mental status, ataxia, slurred speech, headache. EXAM: MRI HEAD WITHOUT CONTRAST TECHNIQUE: Multiplanar, multiecho pulse sequences of the brain and surrounding structures were obtained without intravenous contrast. COMPARISON:  Prior CT from earlier the same day. FINDINGS: Brain: Diffuse prominence of the CSF containing spaces compatible with generalized cerebral atrophy, advanced for age. Confluent T2/FLAIR hyperintensity within the periventricular and deep white matter both cerebral hemispheres, fairly advanced in nature, and most likely related to chronic microvascular ischemic change. Multiple remote lacunar infarcts present within the bilateral basal ganglia/corona radiata as well as the thalami and pons. Chronic hemosiderin staining noted about a right basal ganglia infarct. Small remote left cerebellar infarct noted.  Few additional scattered chronic micro hemorrhages noted involving the supratentorial brain, likely related to hypertension and/or small vessel disease. 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 No associated edema to suggest acute hemorrhage. No abnormal foci of restricted diffusion to suggest acute or subacute ischemia. Gray-white matter differentiation maintained. No acute intracranial hemorrhage. No other mass lesion. No midline shift or mass effect. No hydrocephalus. No  extra-axial fluid collection. Pituitary gland within normal limits. Vascular: Major intracranial vascular flow voids are maintained. Skull and upper cervical spine: Craniocervical junction normal. Upper cervical spine within normal limits. Bone marrow signal intensity within normal limits. Scalp soft tissues unremarkable. Sinuses/Orbits: Globes and orbital soft tissues within normal limits. Mild mucosal thickening within the ethmoidal air cells. Paranasal sinuses are otherwise clear. No mastoid effusion. Other: None. IMPRESSION: 1. No acute intracranial abnormality. 2. Moderately advanced cerebral atrophy with chronic small vessel ischemic disease, with multiple remote lacunar infarcts involving the bilateral basal ganglia, thalami, and pons. 3. 13 mm lesion with T2 hypointense rim involving the right cerebellar hemisphere, corresponding with previously noted hyperdensity. 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 No evidence associated acute or recent hemorrhage. 4. Few additional scattered chronic micro hemorrhages scattered throughout the brain, favored to be related to poorly controlled hypertension. Electronically Signed   By: Jeannine Boga M.D.   On: 05/18/2018 21:39        Scheduled Meds: .  stroke: mapping our early stages of recovery book   Does not apply Once  . amLODipine  10 mg Oral Daily  . aspirin EC  81 mg Oral Daily  . hydrochlorothiazide  25 mg Oral Daily  . senna-docusate  1 tablet Oral BID   Continuous Infusions:   LOS: 1 day    Time spent: [place], MD [facility]  If 7PM-7AM, please contact night-coverage [email or web address] Password [email or web address] 05/19/2018, 12:01 PM

## 2018-05-19 NOTE — Progress Notes (Signed)
PT Cancellation Note  Patient Details Name: Linda Lynch MRN: [id number] DOB: Sep 21, 1955   Cancelled Treatment:    Reason Eval/Treat Not Completed: Active bedrest order   Sandy Salaam Orlin Kann 05/19/2018, 7:11 AM  Adna Nofziger Pam Drown, PT Acute Rehabilitation Services Pager: [phone] Office: [phone]

## 2018-05-19 NOTE — Consult Note (Signed)
Neurology Consultation Reason for Consult: Abnormal head CT Referring Physician: Werner Lean  CC: I does have a man called  History is obtained from: Patient, chart  HPI: Linda Lynch is a 62 y.o. female who presented to the emergency department for reasons that are currently unclear to me.  She reported to the emergency department that she had been having unsteady gait and sounding like she is drunk for the past week.  She reports to me that there has been no change in this for the past 10 years, and there is nothing new from the standpoint.  She did, however, want to be checked out for this and therefore presented to the emergency department yesterday.  She has schizophrenia and was reportedly not taking her medications.  In the emergency department, head CT was obtained which shows a hyperdensity in the right cerebellum..  The patient is a very poor historian, and despite multiple times that I question her, I am unclear on exactly why she came into the emergency department yesterday.   LKW: 10 years ago tpa given?: no, ICH, outside of window    ROS: A 14 point ROS was performed and is negative except as noted in the HPI.  Past Medical History:  Diagnosis Date  . Schizophrenia ([place])      Family History  Problem Relation Age of Onset  . Hypertension Mother      Social History:  reports that she has never smoked. She does not have any smokeless tobacco history on file. She reports that she does not drink alcohol or use drugs.   Exam: Current vital signs: BP (!) 148/77 (BP Location: Left Arm)   Pulse 80   Temp 97.7 F (36.5 C) (Oral)   Resp (!) 21   SpO2 98%  Vital signs in last 24 hours: Temp:  [97.7 F (36.5 C)-98.5 F (36.9 C)] 97.7 F (36.5 C) (10/15 0408) Pulse Rate:  [71-118] 80 (10/15 0408) Resp:  [14-28] 21 (10/15 0408) BP: (146-218)/(77-120) 148/77 (10/15 0408) SpO2:  [94 %-100 %] 98 % (10/15 0408)   Physical Exam  Constitutional: Appears  well-developed and well-nourished.  Psych: Affect appropriate to situation Eyes: No scleral injection HENT: No OP obstrucion Head: Normocephalic.  Cardiovascular: Normal rate and regular rhythm.  Respiratory: Effort normal, non-labored breathing GI: Soft.  No distension. There is no tenderness.  Skin: WDI  Neuro: Mental Status: Patient is awake, alert, oriented to person, place, month, year, and situation. Patient is able to give a clear and coherent history. No signs of aphasia or neglect Cranial Nerves: II: Visual Fields are full. Pupils are equal, round, and reactive to light.   III,IV, VI: EOMI without ptosis or diploplia.  V: Facial sensation is symmetric to temperature VII: Facial movement is symmetric.  VIII: hearing is intact to voice X: Uvula elevates symmetrically XI: Shoulder shrug is symmetric. XII: tongue is midline without atrophy or fasciculations.  Motor: Tone is normal. Bulk is normal. 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  With recent worsening, possibilities include mild extension of the cavernoma versus recrudescence of previous symptoms due to her severe hypertension.  Recommendations: 1) BP control per internal medicine, I would favor using goal of less than 180 but ultimately normotension would be the goal. 2) I think that antiplatelets for secondary stroke prevention  with baby aspirin would likely be okay, but would avoid anticoagulation at this time. 3) stroke team to follow  Roland Rack, MD [facility] [phone]  If 7pm- 7am, please page neurology on call as listed in [place].

## 2018-05-19 NOTE — Progress Notes (Signed)
  Echocardiogram 2D Echocardiogram has been performed.  Linda Lynch 05/19/2018, 3:13 PM

## 2018-05-19 NOTE — Evaluation (Signed)
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 PMHx: schizophrenia, HTN   Clinical Impression   Pt admitted with the above diagnosis and has the deficits listed below. Pt would benefit from cont OT to increase safety and independence with basic adls and adl transfers and increase her awareness and problem solving of safety situations at home so she can eventually d/c back home with her mother.  Pt is very unreliable historian at this time with some expressive aphasia so very difficult to assess her home situation.  Do feel in her current state, she needs 24/7 supervision and possibly min assist at home.  Unsure if her mother can provide this as she has cancer (per pt) and the pt takes care of the mother.  Did speak with social work at length about this pt and her discharge scenario.    Follow Up Recommendations  SNF    Equipment Recommendations  Other (comment)(tbd)    Recommendations for Other Services       Precautions / Restrictions Precautions Precautions: Fall Restrictions Weight Bearing Restrictions: No      Mobility Bed Mobility Overal bed mobility: Modified Independent             General bed mobility comments: sitting EOB on arrival  Transfers Overall transfer level: Needs assistance Equipment used: Rolling walker (2 wheeled) Transfers: Sit to/from Stand Sit to Stand: Supervision         General transfer comment: supervision for safety to stand from bed    Balance Overall balance assessment: Needs assistance Sitting-balance support: No upper extremity supported Sitting balance-Leahy Scale: Good Sitting balance - Comments: pt in circle sitting on EOB on arrival, eating.    Standing balance support: Bilateral upper extremity supported;During functional  activity Standing balance-Leahy Scale: Poor Standing balance comment: reliance on bil UE for support in standing and with functional activity.                            ADL either performed or assessed with clinical judgement   ADL Overall ADL's : Needs assistance/impaired Eating/Feeding: Independent;Sitting   Grooming: Wash/dry hands;Wash/dry face;Oral care;Min guard;Standing Grooming Details (indicate cue type and reason): Pt walked to sink and groomed with supervision and min guard to get to sink with walker. Upper Body Bathing: Set up;Sitting   Lower Body Bathing: Min guard;Sit to/from stand;Cueing for sequencing Lower Body Bathing Details (indicate cue type and reason): Cues for initiation and min guard only when standing. Upper Body Dressing : Set up;Sitting   Lower Body Dressing: Minimal assistance;Sit to/from stand;Cueing for sequencing Lower Body Dressing Details (indicate cue type and reason): cues for initiation and min guard when on her feet. Toilet Transfer: Min guard;Comfort height toilet;Ambulation;Grab bars;RW Armed forces technical officer Details (indicate cue type and reason): Pt walked to bathroom slowly with min guard to toilet with min guard.  Pt requires extra time to complete all adls. Toileting- Water quality scientist and Hygiene: Min guard;Sit to/from stand Toileting - Clothing Manipulation Details (indicate cue type and reason): min guard for balance in standing.     Functional mobility during ADLs: Minimal assistance;Rolling walker General ADL Comments: Pt was slow to initiate and complete adls but could do them given extra time. Feel pt is a safety risk as she is not able to really complete  a thought or answer and problem solving questions. Pt appears confused and unable to express herself completely.     Vision Baseline Vision/History: Wears glasses(glasses are at home.  Says wears them all the time.) Wears Glasses: At all times Patient Visual Report: No  change from baseline Vision Assessment?: No apparent visual deficits Additional Comments: Pt does not have glasses but could read menu and could read clock when asked      Perception Perception Perception Tested?: No   Praxis Praxis Praxis tested?: Not tested    Pertinent Vitals/Pain Pain Assessment: No/denies pain Faces Pain Scale: Hurts little more Pain Location: denies pain in sitting, but claims that has genital pain with urination.      Hand Dominance Right   Extremity/Trunk Assessment Upper Extremity Assessment Upper Extremity Assessment: Generalized weakness;Overall [facility] for tasks assessed;Defer to OT evaluation   Lower Extremity Assessment Lower Extremity Assessment: Generalized weakness   Cervical / Trunk Assessment Cervical / Trunk Assessment: Normal   Communication Communication Communication: No difficulties   Cognition Arousal/Alertness: Awake/alert Behavior During Therapy: Flat affect Overall Cognitive Status: Impaired/Different from baseline Area of Impairment: Orientation;Memory;Problem solving                 Orientation Level: Situation   Memory: Decreased short-term memory       Problem Solving: Slow processing General Comments: pt stating police officers brought her to the hospital   General Comments  Pt very difficult to assess from cognitive standpoint as she was not answering questions easily.  Unclear of pts home situation overall which makes discharge planning difficult.  In her current cognitive state, feel pt needs 24 hour S and assist vs. SNF if 24 hour supervision not available.  Unsure of mothers ability to care for her as she supposedly has cancer and pt states she cares for her mother.    Exercises     Shoulder Instructions      Home Living Family/patient expects to be discharged to:: Private residence Living Arrangements: Parent Available Help at Discharge: Family Type of Home: House Home Access: Stairs to enter Engineer, site of Steps: 4 Entrance Stairs-Rails: Right Home Layout: One level     Bathroom Shower/Tub: Corporate investment banker: Standard         Additional Comments: pt doesn't drive,       Prior Functioning/Environment Level of Independence: Needs assistance  Gait / Transfers Assistance Needed: pt reports she furniture walks and uses RW at times ADL's / Homemaking Assistance Needed: pt reports she hasn't been able to bathe   Comments: meals on wheels once a day, pt was helping care for mom. Pt not a reliable historian        OT Problem List: Impaired balance (sitting and/or standing);Decreased cognition;Decreased safety awareness;Decreased knowledge of use of DME or AE;Decreased knowledge of precautions;Pain      OT Treatment/Interventions: Self-care/ADL training;Therapeutic activities;Balance training;Cognitive remediation/compensation    OT Goals(Current goals can be found in the care plan section) Acute Rehab OT Goals Patient Stated Goal: feel better OT Goal Formulation: With patient Time For Goal Achievement: 06/02/18 Potential to Achieve Goals: Good ADL Goals Additional ADL Goal #1: Pt will walk to toilet with walker and do all toieting with modified independence. Additional ADL Goal #2: Pt will gather all clothing needed to dress with walker and fully dress self with supervision. Additional ADL Goal #3: Pt will answer 5/5 safey questions for home with 100% accurace and no assist.  OT Frequency: Min  2X/week   Barriers to D/C: Decreased caregiver support  lives with mother. She supposedly cares for the mother so don't think mother can care for her.        Co-evaluation              AM-PAC PT "6 Clicks" Daily Activity     Outcome Measure Help from another person eating meals?: None Help from another person taking care of personal grooming?: None Help from another person toileting, which includes using toliet, bedpan, or urinal?: A  Little Help from another person bathing (including washing, rinsing, drying)?: A Little Help from another person to put on and taking off regular upper body clothing?: None Help from another person to put on and taking off regular lower body clothing?: A Little 6 Click Score: 21   End of Session Equipment Utilized During Treatment: Rolling walker Nurse Communication: Mobility status;Other (comment)(spoke with social work as well about home situation.)  Activity Tolerance: Patient tolerated treatment well Patient left: in bed;with call bell/phone within reach;Other (comment)(with SLP)  OT Visit Diagnosis: Unsteadiness on feet (R26.81);Ataxia, unspecified (R27.0);Cognitive communication deficit (R41.841) Symptoms and signs involving cognitive functions: Other Nontraumatic ICH                Time: 1105-1130 OT Time Calculation (min): 25 min Charges:  OT General Charges $OT Visit: 1 Visit OT Evaluation $OT Eval Moderate Complexity: 1 Mod OT Treatments $Self Care/Home Management : 8-22 mins  Jinger Neighbors, OTR/L [phone]  Glenford Peers 05/19/2018, 11:52 AM

## 2018-05-19 NOTE — Evaluation (Signed)
Speech Language Pathology Evaluation Patient Details Name: Linda Lynch MRN: [id number] DOB: [birth year] Today's Date: 05/19/2018 Time: 7062-3762 SLP Time Calculation (min) (ACUTE ONLY): 16 min  Problem List:  Patient Active Problem List   Diagnosis Date Noted  . ICH (intracerebral hemorrhage) ([place]) 05/18/2018  . Hypertensive urgency 05/18/2018   Past Medical History:  Past Medical History:  Diagnosis Date  . Schizophrenia [facility])    Past Surgical History: History reviewed. No pertinent surgical history. HPI:  [name] is a 62 y.o. female with history of schizophrenia not taking her meds presenting with unsteady gait for 1 week. CT shows CT head small hyperdenisty R. cerebellum (caldification vs cavernoma). 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 Atrophy. MRi shows no acute stroke.    Assessment / Plan / Recommendation Clinical Impression  Pt demonstrates cognitive impairment in areas of long term memory, focus, endurance likely related to baseline mental illness. In addition pt noted to have mild expressive aphasia with hesitations prevalent in conversational speech, pt responsive to semantic cues and circumlocution for word finding. She reports she has never addressed this problem with therapy, but tears up a little when asked if she would like to be able to communicate better. Recommend f/u at next level of care, potentially home health depending on other needs.     SLP Assessment  SLP Recommendation/Assessment: Patient needs continued Speech Lanaguage Pathology Services SLP Visit Diagnosis: Aphasia (R47.01)    Follow Up Recommendations  Home health SLP    Frequency and Duration           SLP Evaluation Cognition  Overall Cognitive Status: Impaired/Different from baseline Arousal/Alertness: Awake/alert Orientation Level: Oriented X4 Attention: Selective Selective Attention: Appears intact Memory: Impaired Memory  Impairment: Decreased long term memory Awareness: Appears intact Problem Solving: Appears intact Safety/Judgment: Appears intact       Comprehension  Auditory Comprehension Overall Auditory Comprehension: Appears within functional limits for tasks assessed Reading Comprehension Reading Status: Unable to assess (comment)(pt uses reading glasses)    Expression Verbal Expression Overall Verbal Expression: Impaired Initiation: No impairment Automatic Speech: Name;Social Response Level of Generative/Spontaneous Verbalization: Conversation Repetition: No impairment Naming: Impairment Responsive: Not tested Confrontation: Impaired Convergent: Not tested Divergent: Not tested Verbal Errors: Semantic paraphasias;Aware of errors Pragmatics: No impairment Effective Techniques: Semantic cues;Open ended questions Written Expression Dominant Hand: Right   Oral / Motor  Oral Motor/Sensory Function Overall Oral Motor/Sensory Function: Within functional limits Motor Speech Overall Motor Speech: Impaired Articulation: Impaired Level of Impairment: Conversation Intelligibility: Intelligible   GO                   Herbie Baltimore, MA CCC-SLP  Acute Rehabilitation Services Pager [phone] Office [phone]  Lynann Beaver 05/19/2018, 12:02 PM

## 2018-05-19 NOTE — Progress Notes (Addendum)
Preliminary notes-- Bilateral carotid duplex exam completed. Bilateral ICAs 40-59% stenosis based on the systolic velocities. Bilateral CCAs appear diffused homogenous soft plaque, left CCA appears intimal thickening. Bilateral vertebral arteries are patent with antegrade flow.  Linda Lynch (RDMS RVT) 05/19/18 2:28 PM

## 2018-05-19 NOTE — CV Procedure (Signed)
Echocardiogram not complete, patient undergoing other testing at this time.  Darlina Sicilian RDCS

## 2018-05-19 NOTE — ED Notes (Signed)
[facility] contacted to transport pt to [facility]

## 2018-05-19 NOTE — Evaluation (Signed)
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 PMHx: schizophrenia, HTN  Clinical Impression  Pt pleasant on arrival, overcome with emotion discussing home situation and her mother's illness. Unsure of pt's historian accuracy, but report was consistent throughout session. Pt claims has not left the house in 15-20 years, even for doctor etc. Pt's meals are provided by meals on wheels (not sure how many meals a day pt is receiving). Pt also reports came in for genital pain, reporting "not a UTI, but concerned someone may have done something." Pt able to transfer with supervision and is min guard with use of RW with ambulation. Pt gait is very guarded and slow with fear of falling, pt also easily distracted by commotion in hallway. Pt min assist without AD with LOB. Pt quickly fatigued at end of ambulation, with need for quick sit. Pt with decreased strength, coordination, balance (see PT problem below for all). Pt will benefit acutely from skilled therapy to address above deficits to maximize functional mobility, independence, activity tolerance and safety.      Follow Up Recommendations SNF;Supervision/Assistance - 24 hour    Equipment Recommendations  Rolling walker with 5" wheels    Recommendations for Other Services       Precautions / Restrictions Precautions Precautions: Fall Restrictions Weight Bearing Restrictions: No      Mobility  Bed Mobility             General bed mobility comments: sitting EOB on arrival  Transfers Overall transfer level: Needs assistance Equipment used: Rolling walker (2 wheeled) Transfers: Sit to/from Stand Sit to Stand: Supervision         General transfer comment: supervision for safety to stand from bed  Ambulation/Gait Ambulation/Gait assistance: Min  guard Gait Distance (Feet): 120 Feet Assistive device: Rolling walker (2 wheeled) Gait Pattern/deviations: Step-through pattern;Trunk flexed;Decreased stride length Gait velocity: decreased  Gait velocity interpretation: <1.8 ft/sec, indicate of risk for recurrent falls General Gait Details: pt with guarded and cautious steps with fear of falling. Pt with no LOB with use of RW for 100 ft, without RW for 20 ft some veering and adducted gait noted. Pt's mother has rollator, and pt is easily distracted by basket in front and does not like to use it. Reports typically reaches for external support from walls and furniture at home. Pt liked the 2-wheel RW and reports would use at home.   Stairs Stairs: (pt deneid stairs attempts, claims has not left house in 10-20 years and to get to hospital this time had police carry her down. )          Wheelchair Mobility    Modified Rankin (Stroke Patients Only)       Balance Overall balance assessment: Needs assistance Sitting-balance support: No upper extremity supported Sitting balance-Leahy Scale: Good Sitting balance - Comments: pt in circle sitting on EOB on arrival, eating.    Standing balance support: Bilateral upper extremity supported;During functional activity Standing balance-Leahy Scale: Poor Standing balance comment: reliance on bil UE for support in standing and with functional activity.                              Pertinent Vitals/Pain Pain Assessment: No/denies pain Faces Pain Scale: Hurts little more Pain Location: denies pain in sitting,  but claims that has genital pain with urination.     Home Living Family/patient expects to be discharged to:: Private residence Living Arrangements: Parent Available Help at Discharge: (P) Family Type of Home: (P) House Home Access: Stairs to enter Entrance Stairs-Rails: Right Entrance Stairs-Number of Steps: [place]: One level   Additional Comments: pt doesn't  drive,     Prior Function Level of Independence: Needs assistance   Gait / Transfers Assistance Needed: pt reports she furniture walks and uses RW at times  ADL's / Homemaking Assistance Needed: pt reports she hasn't been able to bathe  Comments: meals on wheels once a day, pt was helping care for mom. Pt not a reliable historian     Hand Dominance   Dominant Hand: Right    Extremity/Trunk Assessment   Upper Extremity Assessment Upper Extremity Assessment: Generalized weakness;Overall [facility] for tasks assessed;Defer to OT evaluation    Lower Extremity Assessment Lower Extremity Assessment: Generalized weakness    Cervical / Trunk Assessment Cervical / Trunk Assessment: Normal  Communication   Communication: No difficulties  Cognition Arousal/Alertness: Awake/alert Behavior During Therapy: Flat affect Overall Cognitive Status: Impaired/Different from baseline Area of Impairment: Orientation;Memory;Problem solving                 Orientation Level: Situation   Memory: Decreased short-term memory       Problem Solving: Slow processing General Comments: pt stating police officers brought her to the hospital      General Comments General comments (skin integrity, edema, etc.): Pt very difficult to assess from cognitive standpoint as she was not answering questions easily.  Unclear of pts home situation overall which makes discharge planning difficult.  In her current cognitive state, feel pt needs 24 hour S and assist vs. SNF if 24 hour supervision not available.  Unsure of mothers ability to care for her as she supposedly has cancer and pt states she cares for her mother.    Exercises     Assessment/Plan    PT Assessment Patient needs continued PT services  PT Problem List Decreased strength;Decreased mobility;Decreased safety awareness;Decreased coordination;Decreased knowledge of precautions;Decreased activity tolerance;Decreased balance;Decreased knowledge of  use of DME;Decreased cognition       PT Treatment Interventions DME instruction;Therapeutic activities;Cognitive remediation;Gait training;Therapeutic exercise;Patient/family education;Stair training;Balance training;Functional mobility training    PT Goals (Current goals can be found in the Care Plan section)  Acute Rehab PT Goals Patient Stated Goal: know what is wrong with her. go home to mom  PT Goal Formulation: With patient Time For Goal Achievement: 06/02/18 Potential to Achieve Goals: Fair    Frequency Min 3X/week   Barriers to discharge Decreased caregiver support lives with mother at home who has cancer and is unable to physically assist.     Co-evaluation               AM-PAC PT "6 Clicks" Daily Activity  Outcome Measure Difficulty turning over in bed (including adjusting bedclothes, sheets and blankets)?: A Lot Difficulty moving from lying on back to sitting on the side of the bed? : A Lot Difficulty sitting down on and standing up from a chair with arms (e.g., wheelchair, bedside commode, etc,.)?: A Little Help needed moving to and from a bed to chair (including a wheelchair)?: A Little Help needed walking in hospital room?: A Little Help needed climbing 3-5 steps with a railing? : A Lot 6 Click Score: 15    End of Session Equipment Utilized During Treatment: Gait  belt Activity Tolerance: Patient tolerated treatment well Patient left: in chair;with call bell/phone within reach;with chair alarm set(alarm belt positioned in chair with demonstration of how to remove.) Nurse Communication: Mobility status PT Visit Diagnosis: Unsteadiness on feet (R26.81);Other abnormalities of gait and mobility (R26.89);Muscle weakness (generalized) (M62.81);Repeated falls (R29.6)    Time: 8209-9068 PT Time Calculation (min) (ACUTE ONLY): 41 min   Charges:   PT Evaluation $PT Eval Moderate Complexity: 1 Mod PT Treatments $Gait Training: 8-22 mins        Samuella Bruin,  [place]  Acute Rehab [phone]   Samuella Bruin 05/19/2018, 11:58 AM

## 2018-05-19 NOTE — Progress Notes (Addendum)
STROKE TEAM PROGRESS NOTE  HPI: ( Dr Leonel Ramsay ) Linda Lynch is a 62 y.o. female who presented to the emergency department for reasons that are currently unclear to me.  Linda Lynch reported to the emergency department that Linda Lynch had been having unsteady gait and sounding like Linda Lynch is drunk for the past week.  Linda Lynch reports to me that there has been no change in this for the past 10 years, and there is nothing new from the standpoint.  Linda Lynch did, however, want to be checked out for this and therefore presented to the emergency department yesterday.  Linda Lynch has schizophrenia and was reportedly not taking her medications. In the emergency department, head CT was obtained which shows a hyperdensity in the right cerebellum.. The patient is a very poor historian, and despite multiple times that I question her, I am unclear on exactly why Linda Lynch came into the emergency department yesterday. LKW: 10 years ago tpa given?: no, [facility], outside of window  INTERVAL HISTORY Her PTs are at the bedside.  Pt at edge of bed eating breakfast. States Linda Lynch has been caring for her sick mother at home . Linda Lynch cannot verbalize why Linda Lynch is in the hospital or if Linda Lynch is having any symptoms.  Vitals:   05/19/18 0804 05/19/18 0830 05/19/18 0847 05/19/18 0900  BP: (!) 178/92 (!) 183/165 (!) 159/79 (!) 142/66  Pulse: (!) 107 97  83  Resp: 16 18 20  (!) 22  Temp:      TempSrc:      SpO2: 100% 98%  98%  Weight:      Height:        CBC:  Recent Labs  Lab 05/18/18 1716  WBC 10.9*  NEUTROABS 8.8*  HGB 16.6*  HCT 49.3*  MCV 90.1  PLT 700    Basic Metabolic Panel:  Recent Labs  Lab 05/18/18 1716  NA 143  K 3.7  CL 107  CO2 27  GLUCOSE 107*  BUN 14  CREATININE 1.04*  CALCIUM 9.6   Lipid Panel: No results found for: CHOL, TRIG, HDL, CHOLHDL, VLDL, LDLCALC HgbA1c: No results found for: HGBA1C Urine Drug Screen:     Component Value Date/Time   LABOPIA NONE DETECTED 05/18/2018 1612   COCAINSCRNUR NONE DETECTED 05/18/2018  1612   LABBENZ NONE DETECTED 05/18/2018 1612   AMPHETMU NONE DETECTED 05/18/2018 1612   THCU NONE DETECTED 05/18/2018 1612   LABBARB NONE DETECTED 05/18/2018 1612    Alcohol Level     Component Value Date/Time   ETH <10 05/18/2018 1716    IMAGING Dg Chest 1 View  Result Date: 05/18/2018 CLINICAL DATA:  Nodular density at the left lung base question nipple shadow. EXAM: CHEST  1 VIEW COMPARISON:  05/18/2018 chest radiograph at 1755 hours FINDINGS: Nipple markers have been placed and repeat imaging of the chest performed. Heart and mediastinal contours are within normal limits. No pulmonary consolidation, effusion or pneumothorax. No pulmonary edema. The nodular density seen previously at the left lung base is not visualized on current exam CT either having represented the left nipple shadow which now projects below the left hemidiaphragm or may have represented a summation of overlapping ribs and vessels. No discrete dominant mass is seen. No pulmonary consolidation or effusion. IMPRESSION: Nonvisualized nodular opacity previously seen at the left lung base laterally. Findings may have represented the left nipple shadow which now projects just below the left hemidiaphragm on current study or possibly a summation of overlapping ribs and vessels previously. Unfortunately, to be  absolutely certain that there is no pulmonary nodule being obscured by the diaphragm, CT of the chest without IV contrast would be the study of choice. Electronically Signed   By: Ashley Royalty M.D.   On: 05/18/2018 20:46   Dg Chest 2 View  Result Date: 05/18/2018 CLINICAL DATA:  Elevated troponin.  Shortness of breath. EXAM: CHEST - 2 VIEW COMPARISON:  None. FINDINGS: 1.2 cm nodular structure in the left lower chest is indeterminate. Otherwise, the lungs are clear. Heart and mediastinum are within normal limits. Trachea is midline. Negative for a pneumothorax. No large pleural effusions. No acute bone abnormality. IMPRESSION:  No active cardiopulmonary disease. Indeterminate 1.2 cm nodular density in left lower chest. This could represent a nipple shadow. Recommend a follow-up chest radiograph with nipple markers. Electronically Signed   By: Markus Daft M.D.   On: 05/18/2018 18:07   Ct Head Wo Contrast  Result Date: 05/18/2018 CLINICAL DATA:  62 year old female with schizophrenia off medication. Initial encounter. EXAM: CT HEAD WITHOUT CONTRAST TECHNIQUE: Contiguous axial images were obtained from the base of the skull through the vertex without intravenous contrast. COMPARISON:  None. FINDINGS: Brain: Small hyperdensity right cerebellum I suspect represents calcification rather than blood and may be related to remote insult such as remote small infarct. Cannot exclude underlying small cavernoma. 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 I suspect these are remote infarcts without CT evidence of large acute infarct. Prominent chronic microvascular changes Mild global atrophy. No intracranial mass lesion seen separate from above described findings. Vascular: No hyperdense vessel. Skull: Hyperostosis frontalis interna incidentally noted. Sinuses/Orbits: No acute orbital abnormality. Visualized paranasal sinuses are clear. Other: Mastoid air cells and middle ear cavities are clear. IMPRESSION: 1. Small hyperdensity right cerebellum I suspect represents calcification rather than blood and may be related to remote insult such as remote small infarct. Cannot exclude underlying small cavernoma. If there is any change in the patient's mental status, this can be re-assessed on follow-up to exclude the less likely consideration of intracranial hemorrhage. 2. 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 I suspect these are remote infarcts without CT evidence of large acute infarct. 3. Prominent chronic microvascular changes 4.  Mild global atrophy. Electronically Signed   By: Genia Del M.D.   On: 05/18/2018 16:57   Mr Brain Wo Contrast  Result Date: 05/18/2018 CLINICAL DATA:  Initial evaluation for acute altered mental status, ataxia, slurred speech, headache. EXAM: MRI HEAD WITHOUT CONTRAST TECHNIQUE: Multiplanar, multiecho pulse sequences of the brain and surrounding structures were obtained without intravenous contrast. COMPARISON:  Prior CT from earlier the same day. FINDINGS: Brain: Diffuse prominence of the CSF containing spaces compatible with generalized cerebral atrophy, advanced for age. Confluent T2/FLAIR hyperintensity within the periventricular and deep white matter both cerebral hemispheres, fairly advanced in nature, and most likely related to chronic microvascular ischemic change. Multiple remote lacunar infarcts present within the bilateral basal ganglia/corona radiata as well as the thalami and pons. Chronic hemosiderin staining noted about a right basal ganglia infarct. Small remote left cerebellar infarct noted. Few additional scattered chronic micro hemorrhages noted involving the supratentorial brain, likely related to hypertension and/or small vessel disease. 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 No associated edema to suggest acute hemorrhage. No abnormal foci of restricted diffusion to suggest acute or subacute ischemia.  Gray-white matter differentiation maintained. No acute intracranial hemorrhage. No other mass lesion. No midline shift or mass effect. No hydrocephalus. No extra-axial fluid collection. Pituitary gland within normal limits. Vascular: Major intracranial vascular flow voids are maintained. Skull and upper cervical spine: Craniocervical junction normal. Upper cervical spine within normal limits. Bone marrow signal intensity within normal limits. Scalp soft  tissues unremarkable. Sinuses/Orbits: Globes and orbital soft tissues within normal limits. Mild mucosal thickening within the ethmoidal air cells. Paranasal sinuses are otherwise clear. No mastoid effusion. Other: None. IMPRESSION: 1. No acute intracranial abnormality. 2. Moderately advanced cerebral atrophy with chronic small vessel ischemic disease, with multiple remote lacunar infarcts involving the bilateral basal ganglia, thalami, and pons. 3. 13 mm lesion with T2 hypointense rim involving the right cerebellar hemisphere, corresponding with previously noted hyperdensity. 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 No evidence associated acute or recent hemorrhage. 4. Few additional scattered chronic micro hemorrhages scattered throughout the brain, favored to be related to poorly controlled hypertension. Electronically Signed   By: Jeannine Boga M.D.   On: 05/18/2018 21:39    PHYSICAL EXAM Pleasant middle-age Caucasian lady sitting comfortably in bed. Not in distress. . Afebrile. Head is nontraumatic. Neck is supple without bruit.    Cardiac exam no murmur or gallop. Lungs are clear to auscultation. Distal pulses are well felt. Neurological Exam ;  Awake  Alert oriented x 3. Slightly slow and hesitant speech and no aphasia or apraxia.[place] movements full without nystagmus.fundi were not visualized. Vision acuity and fields appear normal. Hearing is normal. Palatal movements are normal. Face symmetric. Tongue midline. Normal strength, tone, reflexes and coordination. Normal sensation. Gait deferred.  ASSESSMENT/PLAN Ms. SIMRAN BOMKAMP is a 62 y.o. female with history of schizophrenia not taking her meds presenting with unsteady gait for 1 week.   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 cerebellum (caldification vs cavernoma). 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 Atrophy.  MRI  No stroke. Mod Small vessel disease. Atrophy.  R  cerebellar cavernoma. Few scattered microhemorrhages  Carotid Doppler  pending   2D Echo  pending   LDL pending   HgbA1c pending   SCDs for VTE prophylaxis  No antithrombotic prior to admission, now on No antithrombotic. Will add aspirin 81 mg daily. Continue at d/c  Therapy recommendations:  pending   Disposition:  pending   Other Stroke Risk Factors  Overweight, Body mass index is 29.25 kg/m., recommend weight loss, diet and exercise as appropriate   Hx stroke/TIA on imaging only  Hypertension  No meds PTA  Now on norvasc and HCTZ   Other Active Problems  Schizophrenia, off meds  Hospital day # Kaycee, MSN, APRN, ANVP-BC, AGPCNP-BC Advanced Practice Stroke Nurse [place] for Schedule & Pager information 05/19/2018 9:38 AM  I have personally examined this patient, reviewed notes, independently viewed imaging studies, participated in medical decision making and plan of care.ROS completed by me personally and pertinent positives fully documented  I have made any additions or clarifications directly to the above note. 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 Recommend aspirin 81 mg daily for stroke prevention and continue ongoing stroke workup and aggressive risk factor modification. No family available at the bedside for discussion. Discussed with Dr. Herbert Moors. Greater than 50%  time during this 35 minute visit was spent on counseling and coordination of care about her abnormal MRI in stroke risk stratification discussion and answering questions.  Antony Contras, MD Medical Director [facility] Pager: [phone] 05/19/2018 1:11 PM  To contact Stroke Continuity provider, please refer to [email or web address] After hours, contact General Neurology

## 2018-05-20 DIAGNOSIS — I619 Nontraumatic intracerebral hemorrhage, unspecified: Secondary | ICD-10-CM

## 2018-05-20 DIAGNOSIS — F29 Unspecified psychosis not due to a substance or known physiological condition: Secondary | ICD-10-CM

## 2018-05-20 DIAGNOSIS — R7989 Other specified abnormal findings of blood chemistry: Secondary | ICD-10-CM

## 2018-05-20 DIAGNOSIS — Z8673 Personal history of transient ischemic attack (TIA), and cerebral infarction without residual deficits: Secondary | ICD-10-CM

## 2018-05-20 DIAGNOSIS — I10 Essential (primary) hypertension: Secondary | ICD-10-CM

## 2018-05-20 DIAGNOSIS — Z008 Encounter for other general examination: Secondary | ICD-10-CM

## 2018-05-20 DIAGNOSIS — E876 Hypokalemia: Secondary | ICD-10-CM

## 2018-05-20 DIAGNOSIS — I6523 Occlusion and stenosis of bilateral carotid arteries: Secondary | ICD-10-CM

## 2018-05-20 LAB — CBC
HCT: 42.8 % (ref 36.0–46.0)
Hemoglobin: 14.7 g/dL (ref 12.0–15.0)
MCH: 30.9 pg (ref 26.0–34.0)
MCHC: 34.3 g/dL (ref 30.0–36.0)
MCV: 89.9 fL (ref 80.0–100.0)
NRBC: 0 % (ref 0.0–0.2)
Platelets: 328 10*3/uL (ref 150–400)
RBC: 4.76 MIL/uL (ref 3.87–5.11)
RDW: 12.4 % (ref 11.5–15.5)
WBC: 14.6 10*3/uL — ABNORMAL HIGH (ref 4.0–10.5)

## 2018-05-20 LAB — BASIC METABOLIC PANEL WITH GFR
Anion gap: 10 (ref 5–15)
CO2: 25 mmol/L (ref 22–32)
Calcium: 9 mg/dL (ref 8.9–10.3)
Creatinine, Ser: 1.3 mg/dL — ABNORMAL HIGH (ref 0.44–1.00)
GFR calc Af Amer: 49 mL/min — ABNORMAL LOW (ref >60)
Glucose, Bld: 120 mg/dL — ABNORMAL HIGH (ref 70–99)
Sodium: 138 mmol/L (ref 135–145)

## 2018-05-20 LAB — BASIC METABOLIC PANEL
BUN: 16 mg/dL (ref 8–23)
Chloride: 103 mmol/L (ref 98–111)
GFR calc non Af Amer: 42 mL/min — ABNORMAL LOW (ref >60)
Potassium: 3.2 mmol/L — ABNORMAL LOW (ref 3.5–5.1)

## 2018-05-20 LAB — MAGNESIUM: Magnesium: 2.2 mg/dL (ref 1.7–2.4)

## 2018-05-20 MED ORDER — POTASSIUM CHLORIDE CRYS ER 20 MEQ PO TBCR
40.0000 meq | EXTENDED_RELEASE_TABLET | ORAL | Status: AC
  Filled 2018-05-20: qty 2

## 2018-05-20 MED ORDER — SODIUM CHLORIDE 0.9 % IV SOLN: INTRAVENOUS | Status: DC

## 2018-05-20 NOTE — Progress Notes (Signed)
CT called to arrange transport for patient's CT Angiogram.  Patient refusing, states she wants to discuss it first with her doctor.

## 2018-05-20 NOTE — Progress Notes (Addendum)
PROGRESS NOTE    Linda Lynch  TSV:[id number] DOB: 11-15-1955 DOA: 05/18/2018 PCP: Patient, No Pcp Per   Brief Narrative:  HPI on 05/18/2018 by Dr. Jennette Kettle Linda Lynch is a 62 y.o. female with medical history significant of schizophrenia.  Patient initially triaged to psychiatric evaluation based on arrival by GPDafter family called and said she was schizophrenic and has been off medications. Reportedly when patient got here she stated she was here for "personal reasons" and pointed to her genital area. On further history taking with mother (who is 63 years old), it seems that patient has been "staggering" with unsteady gait for the past week.  The patient though reports that this has been ongoing for "15-20 years". Patient denies headache.  Denies chest pain.  Does have some SOB. Patient denies any PMH, states mother has HTN.  Patient doesn't seem like the most reliable historian.  Interim history Admitted for speech and balance issues. She was found to have hypertensive emergency with elevated troponins.  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  Neurology consulted.  Also has some psychotic features, psychiatry consulted. Assessment & Plan   Gait instability/dysarthria -Unclear etiology -CT head: Small hyperdensity right cerebellum, ?  Calcification versus blood.  No evidence of large acute infarct -MRI brain: No acute intracranial abnormality.  Moderately advanced cerebral atrophy with chronic small vessel ischemic disease with multiple remote lacunar infarcts involving bilateral basal ganglia, thalami and pons.  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  No evidence associated acute or recent hemorrhage. -Echocardiogram shows an EF of 70 to 92%, grade 1 diastolic dysfunction -Carotid Doppler shows bilateral ICA 40 to 59%  stenosis.  Bilateral CCA appear diffused heterogeneous soft plaque, left CCA appears intimal thickening.  Bilateral vertebral arteries are patent with antegrade flow -Toxicology screen negative -LDL 136, hemoglobin A1c 5 -UA unremarkable  -Neurology consulted and appreciated, recommended aspirin 1 mg for stroke prevention.  Does not feel that this cavernoma is because of patient's symptoms. -CTA head/neck pending (patient continues to refuse) -PT and OT recommended SNF  Hypertensive emergency/elevated troponin -Systolic blood pressures over 200 -Unclear etiology -Patient placed on oral medications however has been refusing to take them.  It does not seem that she is on medications at home. -She reports supposedly having a 10-year history of chest pain which is been unchanged. -EKG reviewed and shows no evidence of ACS -Echocardiogram -Continue amlodipine, HCTZ, IV hydralazine -Will obtain repeat troponin, peaked at 0.35  History of CVA/lacunar infarcts -Noted on CT scan as well as MRI -No sequelae from these are evident, likely secondary to uncontrolled hypertension -Continue aspirin 81 mg and control blood pressure -Carotid Doppler as above  Psychiatric history -Patient does appear to have psychotic features at this time- however unknown baseline -Psychiatry consulted and appreciated  Hypokalemia -Replace and continue to monitor BMP  DVT Prophylaxis  SCDs  Code Status: Full  Family Communication: None at beside. Mother via phone (states she is not easy to get along with at times. She states she no longer takes any of her meds and was hospitalized in [place], [place]. Discussed with mother the possibility that the patient may have to return home if SNF is not an option, mother voiced understanding.)  Disposition Plan: Admitted. Pending psychiatry consult. Will likely need SNF.   Consultants Neurology Psychiatry   Procedures  Echocardiogram Carotid doppler  Antibiotics  Anti-infectives (From admission, onward)   None      Subjective:   Linda Lynch seen and examined today.  Does not understand why she is in the hospital. Complains about pain and bruising from blood draw as well as BP cuff. Does not want additional testing and wishes to go home. Denies current chest pain, shortness of breath, abdominal pain, nausea, vomiting.   Objective:   Vitals:   05/20/18 0707 05/20/18 0800 05/20/18 0917 05/20/18 1147  BP: (!) 182/104  (!) 196/88 (!) 202/85  Pulse:   (!) 104 91  Resp:  13    Temp:   97.8 F (36.6 C) 98.4 F (36.9 C)  TempSrc:   Oral Oral  SpO2:   100% 98%  Weight:      Height:        Intake/Output Summary (Last 24 hours) at 05/20/2018 1223 Last data filed at 05/19/2018 2147 Gross per 24 hour  Intake 360 ml  Output -  Net 360 ml   Filed Weights   05/19/18 0600  Weight: 77.3 kg    Exam  General: Well developed, well nourished, NAD, appears stated age  HEENT: NCAT, mucous membranes moist.   Neck: Supple  Cardiovascular: S1 S2 auscultated, RRR, no murmurs  Respiratory: Clear to auscultation bilaterally with equal chest rise  Abdomen: Soft, nontender, nondistended, + bowel sounds  Extremities: warm dry without cyanosis clubbing. Trace LE edema.   Neuro: AAOx3 (however not situation), nonfocal   Skin: Without rashes exudates or nodules  Psych: Anxious, psychotic, impaired judgement and insight    Data Reviewed: I have personally reviewed following labs and imaging studies  CBC: Recent Labs  Lab 05/18/18 1716 05/20/18 0528  WBC 10.9* 14.6*  NEUTROABS 8.8*  --   HGB 16.6* 14.7  HCT 49.3* 42.8  MCV 90.1 89.9  PLT 358 884   Basic Metabolic Panel: Recent Labs  Lab 05/18/18 1716 05/20/18 0435  NA 143 138  K 3.7 3.2*  CL 107 103  CO2 27 25  GLUCOSE 107* 120*  BUN 14 16  CREATININE 1.04* 1.30*  CALCIUM 9.6 9.0  MG  --  2.2   GFR: Estimated Creatinine Clearance: 44 mL/min (A) (by C-G formula based on  SCr of 1.3 mg/dL (H)). Liver Function Tests: Recent Labs  Lab 05/18/18 1716  AST 22  ALT 18  ALKPHOS 99  BILITOT 0.7  PROT 8.2*  ALBUMIN 4.4   No results for input(s): LIPASE, AMYLASE in the last 168 hours. No results for input(s): AMMONIA in the last 168 hours. Coagulation Profile: Recent Labs  Lab 05/18/18 1716  INR 0.87   Cardiac Enzymes: No results for input(s): CKTOTAL, CKMB, CKMBINDEX, TROPONINI in the last 168 hours. BNP (last 3 results) No results for input(s): PROBNP in the last 8760 hours. HbA1C: Recent Labs    05/19/18 1031  HGBA1C 5.0   CBG: No results for input(s): GLUCAP in the last 168 hours. Lipid Profile: Recent Labs    05/19/18 1031  CHOL 195  HDL 43  LDLCALC 136*  TRIG 81  CHOLHDL 4.5   Thyroid Function Tests: No results for input(s): TSH, T4TOTAL, FREET4, T3FREE, THYROIDAB in the last 72 hours. Anemia Panel: No results for input(s): VITAMINB12, FOLATE, FERRITIN, TIBC, IRON, RETICCTPCT in the last 72 hours. Urine analysis:    Component Value Date/Time   COLORURINE STRAW (A) 05/18/2018 1613   APPEARANCEUR CLEAR 05/18/2018 1613   LABSPEC 1.008 05/18/2018 1613   PHURINE 8.0 05/18/2018 1613   GLUCOSEU  NEGATIVE 05/18/2018 1613   HGBUR NEGATIVE 05/18/2018 1613   BILIRUBINUR NEGATIVE 05/18/2018 [place] 05/18/2018 1613   PROTEINUR >=300 (A) 05/18/2018 1613   NITRITE NEGATIVE 05/18/2018 1613   LEUKOCYTESUR SMALL (A) 05/18/2018 1613   Sepsis Labs: @LABRCNTIP (procalcitonin:4,lacticidven:4)  ) Recent Results (from the past 240 hour(s))  MRSA PCR Screening     Status: None   Collection Time: 05/19/18  6:07 AM  Result Value Ref Range Status   MRSA by PCR NEGATIVE NEGATIVE Final    Comment:        The GeneXpert MRSA Assay (FDA approved for NASAL specimens only), is one component of a comprehensive MRSA colonization surveillance program. It is not intended to diagnose MRSA infection nor to guide or monitor treatment  for MRSA infections. Performed at [facility], [employer] [address] [place], [place] [address]       Radiology Studies: Dg Chest 1 View  Result Date: 05/18/2018 CLINICAL DATA:  Nodular density at the left lung base question nipple shadow. EXAM: CHEST  1 VIEW COMPARISON:  05/18/2018 chest radiograph at 1755 hours FINDINGS: Nipple markers have been placed and repeat imaging of the chest performed. Heart and mediastinal contours are within normal limits. No pulmonary consolidation, effusion or pneumothorax. No pulmonary edema. The nodular density seen previously at the left lung base is not visualized on current exam CT either having represented the left nipple shadow which now projects below the left hemidiaphragm or may have represented a summation of overlapping ribs and vessels. No discrete dominant mass is seen. No pulmonary consolidation or effusion. IMPRESSION: Nonvisualized nodular opacity previously seen at the left lung base laterally. Findings may have represented the left nipple shadow which now projects just below the left hemidiaphragm on current study or possibly a summation of overlapping ribs and vessels previously. Unfortunately, to be absolutely certain that there is no pulmonary nodule being obscured by the diaphragm, CT of the chest without IV contrast would be the study of choice. Electronically Signed   By: Ashley Royalty M.D.   On: 05/18/2018 20:46   Dg Chest 2 View  Result Date: 05/18/2018 CLINICAL DATA:  Elevated troponin.  Shortness of breath. EXAM: CHEST - 2 VIEW COMPARISON:  None. FINDINGS: 1.2 cm nodular structure in the left lower chest is indeterminate. Otherwise, the lungs are clear. Heart and mediastinum are within normal limits. Trachea is midline. Negative for a pneumothorax. No large pleural effusions. No acute bone abnormality. IMPRESSION: No active cardiopulmonary disease. Indeterminate 1.2 cm nodular density in left lower chest. This could represent a nipple  shadow. Recommend a follow-up chest radiograph with nipple markers. Electronically Signed   By: Markus Daft M.D.   On: 05/18/2018 18:07   Ct Head Wo Contrast  Result Date: 05/18/2018 CLINICAL DATA:  62 year old female with schizophrenia off medication. Initial encounter. EXAM: CT HEAD WITHOUT CONTRAST TECHNIQUE: Contiguous axial images were obtained from the base of the skull through the vertex without intravenous contrast. COMPARISON:  None. FINDINGS: Brain: Small hyperdensity right cerebellum I suspect represents calcification rather than blood and may be related to remote insult such as remote small infarct. Cannot exclude underlying small cavernoma. 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 I suspect these are remote infarcts without CT evidence of large acute infarct. Prominent chronic microvascular changes Mild global atrophy. No intracranial mass lesion seen separate from above described findings. Vascular: No hyperdense vessel. Skull: Hyperostosis frontalis interna incidentally noted. Sinuses/Orbits: No acute  orbital abnormality. Visualized paranasal sinuses are clear. Other: Mastoid air cells and middle ear cavities are clear. IMPRESSION: 1. Small hyperdensity right cerebellum I suspect represents calcification rather than blood and may be related to remote insult such as remote small infarct. Cannot exclude underlying small cavernoma. If there is any change in the patient's mental status, this can be re-assessed on follow-up to exclude the less likely consideration of intracranial hemorrhage. 2. 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 I suspect these are remote infarcts without CT evidence of large acute infarct. 3. Prominent chronic microvascular changes 4. Mild global atrophy. Electronically Signed   By: Genia Del M.D.   On: 05/18/2018 16:57   Mr Brain Wo  Contrast  Result Date: 05/18/2018 CLINICAL DATA:  Initial evaluation for acute altered mental status, ataxia, slurred speech, headache. EXAM: MRI HEAD WITHOUT CONTRAST TECHNIQUE: Multiplanar, multiecho pulse sequences of the brain and surrounding structures were obtained without intravenous contrast. COMPARISON:  Prior CT from earlier the same day. FINDINGS: Brain: Diffuse prominence of the CSF containing spaces compatible with generalized cerebral atrophy, advanced for age. Confluent T2/FLAIR hyperintensity within the periventricular and deep white matter both cerebral hemispheres, fairly advanced in nature, and most likely related to chronic microvascular ischemic change. Multiple remote lacunar infarcts present within the bilateral basal ganglia/corona radiata as well as the thalami and pons. Chronic hemosiderin staining noted about a right basal ganglia infarct. Small remote left cerebellar infarct noted. Few additional scattered chronic micro hemorrhages noted involving the supratentorial brain, likely related to hypertension and/or small vessel disease. 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 No associated edema to suggest acute hemorrhage. No abnormal foci of restricted diffusion to suggest acute or subacute ischemia. Gray-white matter differentiation maintained. No acute intracranial hemorrhage. No other mass lesion. No midline shift or mass effect. No hydrocephalus. No extra-axial fluid collection. Pituitary gland within normal limits. Vascular: Major intracranial vascular flow voids are maintained. Skull and upper cervical spine: Craniocervical junction normal. Upper cervical spine within normal limits. Bone marrow signal intensity within normal limits. Scalp soft tissues unremarkable. Sinuses/Orbits: Globes and orbital soft tissues within normal limits. Mild mucosal  thickening within the ethmoidal air cells. Paranasal sinuses are otherwise clear. No mastoid effusion. Other: None. IMPRESSION: 1. No acute intracranial abnormality. 2. Moderately advanced cerebral atrophy with chronic small vessel ischemic disease, with multiple remote lacunar infarcts involving the bilateral basal ganglia, thalami, and pons. 3. 13 mm lesion with T2 hypointense rim involving the right cerebellar hemisphere, corresponding with previously noted hyperdensity. 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 No evidence associated acute or recent hemorrhage. 4. Few additional scattered chronic micro hemorrhages scattered throughout the brain, favored to be related to poorly controlled hypertension. Electronically Signed   By: Jeannine Boga M.D.   On: 05/18/2018 21:39     Scheduled Meds: .  stroke: mapping our early stages of recovery book   Does not apply Once  . amLODipine  10 mg Oral Daily  . aspirin EC  81 mg Oral Daily  . atorvastatin  20 mg Oral q1800  . hydrochlorothiazide  25 mg Oral Daily  . Influenza vac split quadrivalent PF  0.5 mL Intramuscular Tomorrow-1000  . potassium chloride  40 mEq Oral Q4H  . senna-docusate  1 tablet Oral BID   Continuous Infusions: . sodium chloride  LOS: 2 days   Time Spent in minutes   45 minutes (greater than 50% of time spent with patient face to face, as well as discussing patient with her mother via phone, as well as reviewing old records, calling consults, and formulating a plan)    Cristal Ford D.O. on 05/20/2018 at 12:23 PM  Between 7am to 7pm - Please see pager noted on [email or web address]  After 7pm go to [email or web address]  And look for the night coverage person covering for me after hours  [facility] Office  [phone]
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
## 2018-05-20 NOTE — Progress Notes (Signed)
Received report from off-going RN. Patient continues to refused to go for her CT Angiogram. She states. 'it's too late at night." Covering NP made aware.

## 2018-05-20 NOTE — Progress Notes (Signed)
ED CSW called [employer] to have [employer] serve pt with IVC paperwork.   Wendelyn Breslow, [facility]  [phone]

## 2018-05-20 NOTE — Progress Notes (Signed)
Patient refused all 10:00 medications. Patient states she prefers to have a stroke over taking meds.  Dr. Ree Kida aware.

## 2018-05-20 NOTE — Social Work (Signed)
CSW acknowledging consult for PCP, for assistance with PCP please consult RN Case Manager. Await recommendations from psychiatry and further medical work up.   Linda Lynch, MSW, [place] [facility] [phone]

## 2018-05-20 NOTE — Social Work (Addendum)
CSW received message from RN Case Manager that pt will need to be IVC'd. Spoke with bedside RN in regards to which physician is requesting IVC. Bedside RN Lenna Sciara states attending MD requested. Paged MD to confirm.   Also await psychiatric evaluation documentation.  3:15pm- IVC paperwork completed and notarized. Await return fax for completed IVC.   3:40pm- CSW received confirmation of IVC from magistrate. Paperwork placed on chart.   Westley Hummer, MSW, [place] [facility] [phone]
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
## 2018-05-20 NOTE — Progress Notes (Addendum)
STROKE TEAM PROGRESS NOTE  INTERVAL HISTORY No family at bedside, patient sitting in chair, comfortably, not in distress.  She has been refusing p.o. medication this morning, and also refusing CT head and neck.  BP still high, her creatinine elevated likely due to dehydration.  Put on IV fluid.  PT/OT recommend SNF, social worker consulted.  She does not have a PCP, social worker also counseled on that.  Vitals:   05/20/18 0707 05/20/18 0800 05/20/18 0917 05/20/18 1147  BP: (!) 182/104  (!) 196/88 (!) 202/85  Pulse:   (!) 104 91  Resp:  13    Temp:   97.8 F (36.6 C) 98.4 F (36.9 C)  TempSrc:   Oral Oral  SpO2:   100% 98%  Weight:      Height:        CBC:  Recent Labs  Lab 05/18/18 1716 05/20/18 0528  WBC 10.9* 14.6*  NEUTROABS 8.8*  --   HGB 16.6* 14.7  HCT 49.3* 42.8  MCV 90.1 89.9  PLT 358 597    Basic Metabolic Panel:  Recent Labs  Lab 05/18/18 1716 05/20/18 0435  NA 143 138  K 3.7 3.2*  CL 107 103  CO2 27 25  GLUCOSE 107* 120*  BUN 14 16  CREATININE 1.04* 1.30*  CALCIUM 9.6 9.0  MG  --  2.2   Lipid Panel:     Component Value Date/Time   CHOL 195 05/19/2018 1031   TRIG 81 05/19/2018 1031   HDL 43 05/19/2018 1031   CHOLHDL 4.5 05/19/2018 1031   VLDL 16 05/19/2018 1031   LDLCALC 136 (H) 05/19/2018 1031   HgbA1c:  Lab Results  Component Value Date   HGBA1C 5.0 05/19/2018   Urine Drug Screen:     Component Value Date/Time   LABOPIA NONE DETECTED 05/18/2018 1612   COCAINSCRNUR NONE DETECTED 05/18/2018 1612   LABBENZ NONE DETECTED 05/18/2018 1612   AMPHETMU NONE DETECTED 05/18/2018 1612   THCU NONE DETECTED 05/18/2018 1612   LABBARB NONE DETECTED 05/18/2018 1612    Alcohol Level     Component Value Date/Time   ETH <10 05/18/2018 1716    IMAGING Ct Head Wo Contrast  Result Date: 05/18/2018 CLINICAL DATA:  62 year old female with schizophrenia off medication. Initial encounter. EXAM: CT HEAD WITHOUT CONTRAST TECHNIQUE: Contiguous axial  images were obtained from the base of the skull through the vertex without intravenous contrast. COMPARISON:  None. FINDINGS: Brain: Small hyperdensity right cerebellum I suspect represents calcification rather than blood and may be related to remote insult such as remote small infarct. Cannot exclude underlying small cavernoma. 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 I suspect these are remote infarcts without CT evidence of large acute infarct. Prominent chronic microvascular changes Mild global atrophy. No intracranial mass lesion seen separate from above described findings. Vascular: No hyperdense vessel. Skull: Hyperostosis frontalis interna incidentally noted. Sinuses/Orbits: No acute orbital abnormality. Visualized paranasal sinuses are clear. Other: Mastoid air cells and middle ear cavities are clear. IMPRESSION: 1. Small hyperdensity right cerebellum I suspect represents calcification rather than blood and may be related to remote insult such as remote small infarct. Cannot exclude underlying small cavernoma. If there is any change in the patient's mental status, this can be re-assessed on follow-up to exclude the less likely consideration of intracranial hemorrhage. 2. 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 I suspect these are remote infarcts without CT evidence of  large acute infarct. 3. Prominent chronic microvascular changes 4. Mild global atrophy. Electronically Signed   By: Genia Del M.D.   On: 05/18/2018 16:57   Mr Brain Wo Contrast  Result Date: 05/18/2018 CLINICAL DATA:  Initial evaluation for acute altered mental status, ataxia, slurred speech, headache. EXAM: MRI HEAD WITHOUT CONTRAST TECHNIQUE: Multiplanar, multiecho pulse sequences of the brain and surrounding structures were obtained without intravenous contrast. COMPARISON:  Prior CT from earlier the same  day. FINDINGS: Brain: Diffuse prominence of the CSF containing spaces compatible with generalized cerebral atrophy, advanced for age. Confluent T2/FLAIR hyperintensity within the periventricular and deep white matter both cerebral hemispheres, fairly advanced in nature, and most likely related to chronic microvascular ischemic change. Multiple remote lacunar infarcts present within the bilateral basal ganglia/corona radiata as well as the thalami and pons. Chronic hemosiderin staining noted about a right basal ganglia infarct. Small remote left cerebellar infarct noted. Few additional scattered chronic micro hemorrhages noted involving the supratentorial brain, likely related to hypertension and/or small vessel disease. 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 No associated edema to suggest acute hemorrhage. No abnormal foci of restricted diffusion to suggest acute or subacute ischemia. Gray-white matter differentiation maintained. No acute intracranial hemorrhage. No other mass lesion. No midline shift or mass effect. No hydrocephalus. No extra-axial fluid collection. Pituitary gland within normal limits. Vascular: Major intracranial vascular flow voids are maintained. Skull and upper cervical spine: Craniocervical junction normal. Upper cervical spine within normal limits. Bone marrow signal intensity within normal limits. Scalp soft tissues unremarkable. Sinuses/Orbits: Globes and orbital soft tissues within normal limits. Mild mucosal thickening within the ethmoidal air cells. Paranasal sinuses are otherwise clear. No mastoid effusion. Other: None. IMPRESSION: 1. No acute intracranial abnormality. 2. Moderately advanced cerebral atrophy with chronic small vessel ischemic disease, with multiple remote lacunar infarcts involving the bilateral basal ganglia, thalami, and pons. 3. 13 mm  lesion with T2 hypointense rim involving the right cerebellar hemisphere, corresponding with previously noted hyperdensity. 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 No evidence associated acute or recent hemorrhage. 4. Few additional scattered chronic micro hemorrhages scattered throughout the brain, favored to be related to poorly controlled hypertension. Electronically Signed   By: Jeannine Boga M.D.   On: 05/18/2018 21:39    PHYSICAL EXAM Pleasant middle-age Caucasian lady sitting comfortably in bed. Not in distress. . Afebrile. Head is nontraumatic. Neck is supple without bruit.    Cardiac exam no murmur or gallop. Lungs are clear to auscultation. Distal pulses are well felt. Neurological Exam  Awake  Alert oriented x 3.  No aphasia, follows simple commands. eye movements full without nystagmus.fundi were not visualized. Vision acuity and fields appear normal. Hearing is normal. Palatal movements are normal. Face symmetric. Tongue midline. Normal strength, tone, reflexes and coordination. Normal sensation. Gait deferred.  ASSESSMENT/PLAN Linda Lynch is a 62 y.o. female with history of schizophrenia not taking her meds presenting with unsteady gait for 1 week.   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 cerebellum (caldification vs cavernoma). 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 Atrophy.  MRI  No stroke. Mod Small vessel disease. Atrophy.  R cerebellar cavernoma. Few scattered microhemorrhages  Carotid Doppler 40 to 55% bilateral ICA  2D Echo EF 70 to 80%  LDL 136  HgbA1c 5.0  SCDs for VTE  prophylaxis  No antithrombotic prior to admission, now on No antithrombotic.  Continue aspirin 81 mg daily. Continue at d/c  Therapy recommendations: SNF  Disposition:  pending   Schizophrenia  Off meds PTA  Abnormal behavior at home  Has been refusing tests and p.o. meds this morning  Carotid stenosis  Carotid  Doppler showed bilateral ICA 40 to 59% stenosis  Patient refusing CT head and neck  Recommend outpatient follow-up with PCP or vascular surgery for monitoring  Hypertension  No meds PTA   BP high  Patient refusing p.o. BP meds  Treatment as per primary team  Psychiatry consult pending  Hyperlipidemia  Home meds none  LDL 136, goal less than 100  Put on Lipitor 20  Continue Lipitor on discharge  Other Stroke Risk Factors  Hx of silent stroke on imaging only  Other Active Problems  Elevated creatinine -1.04-1.3 - put on IV fluid  Leukocytosis, WBC 10.9-14.6 - afebrile  Hospital day # 2  Neurology will sign off. Please call with questions.  No neuro follow-up needed at this time. Thanks for the consult.  Rosalin Hawking, MD PhD Stroke Neurology 05/20/2018 1:08 PM  To contact Stroke Continuity provider, please refer to [email or web address] After hours, contact General Neurology

## 2018-05-21 DIAGNOSIS — E785 Hyperlipidemia, unspecified: Secondary | ICD-10-CM

## 2018-05-21 DIAGNOSIS — F209 Schizophrenia, unspecified: Secondary | ICD-10-CM

## 2018-05-21 DIAGNOSIS — D72829 Elevated white blood cell count, unspecified: Secondary | ICD-10-CM

## 2018-05-21 LAB — CBC
HEMATOCRIT: 44.7 % (ref 36.0–46.0)
Hemoglobin: 14.5 g/dL (ref 12.0–15.0)
MCH: 29.7 pg (ref 26.0–34.0)
MCHC: 32.4 g/dL (ref 30.0–36.0)
MCV: 91.6 fL (ref 80.0–100.0)
Platelets: 335 10*3/uL (ref 150–400)
RBC: 4.88 MIL/uL (ref 3.87–5.11)
RDW: 12.6 % (ref 11.5–15.5)
WBC: 15.8 10*3/uL — AB (ref 4.0–10.5)
nRBC: 0 % (ref 0.0–0.2)

## 2018-05-21 LAB — TROPONIN I: Troponin I: 0.08 ng/mL (ref <0.03)

## 2018-05-21 LAB — BASIC METABOLIC PANEL
ANION GAP: 12 (ref 5–15)
BUN: 16 mg/dL (ref 8–23)
CALCIUM: 8.7 mg/dL — AB (ref 8.9–10.3)
CO2: 23 mmol/L (ref 22–32)
CREATININE: 1.39 mg/dL — AB (ref 0.44–1.00)
Chloride: 103 mmol/L (ref 98–111)
GFR, EST AFRICAN AMERICAN: 45 mL/min — AB (ref >60)
GFR, EST NON AFRICAN AMERICAN: 39 mL/min — AB (ref >60)
Glucose, Bld: 107 mg/dL — ABNORMAL HIGH (ref 70–99)
Potassium: 3.4 mmol/L — ABNORMAL LOW (ref 3.5–5.1)
Sodium: 138 mmol/L (ref 135–145)

## 2018-05-21 MED ORDER — POTASSIUM CHLORIDE CRYS ER 20 MEQ PO TBCR
40.0000 meq | EXTENDED_RELEASE_TABLET | Freq: Once | ORAL | Status: AC
  Administered 2018-05-21: 40 meq via ORAL
  Filled 2018-05-21: qty 2

## 2018-05-21 MED ORDER — METOPROLOL TARTRATE 25 MG PO TABS
25.0000 mg | ORAL_TABLET | Freq: Two times a day (BID) | ORAL | Status: DC
  Administered 2018-05-21 – 2018-05-27 (×13): 25 mg via ORAL
  Filled 2018-05-21 (×13): qty 1

## 2018-05-21 NOTE — Plan of Care (Signed)
Min assist adls

## 2018-05-21 NOTE — Progress Notes (Signed)
  Speech Language Pathology Treatment: Cognitive-Linquistic  Patient Details Name: Linda Lynch MRN: [id number] DOB: 19-Jul-1956 Today's Date: 05/21/2018 Time: 1420-1450 SLP Time Calculation (min) (ACUTE ONLY): 30 min  Assessment / Plan / Recommendation Clinical Impression  Pt much more automatic with all speech tasks during card game, naming pictures, using abstract reasoning, though extra time was needed. No phonemic cues needed throughout session. Will f/u with higher level language tasks though suspect slow processing with expressive language is related to baseline psychiatric impairment that is likely to fluctuate. Pt verbalizes interest in continuing interventions.    HPI HPI: [name] is a 62 y.o. female with history of schizophrenia not taking her meds presenting with unsteady gait for 1 week. CT shows CT head small hyperdenisty R. cerebellum (caldification vs cavernoma). 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 Atrophy. MRi shows no acute stroke.       SLP Plan  Continue with current plan of care       Recommendations                   Plan: Continue with current plan of care       GO               Herbie Baltimore, MA [place] Pager [phone] Office [phone]  Lynann Beaver 05/21/2018, 3:13 PM

## 2018-05-21 NOTE — NC FL2 (Signed)
[place] LEVEL OF CARE SCREENING TOOL     IDENTIFICATION  Patient Name: Linda Lynch Birthdate: 1956-05-02 Sex: female Admission Date (Current Location): 05/18/2018  [place] and [place] Number:  Herbalist and Address:  [facility] [place], [place] [address], [place], [place] [address]      Provider Number: [id number]  Attending Physician Name and Address:  Cristal Ford, DO  Relative Name and Phone Number:       Current Level of Care: Hospital Recommended Level of Care: [place] Prior Approval Number:    Date Approved/Denied:   PASRR Number: Pending; manual review  Discharge Plan: SNF    Current Diagnoses: Patient Active Problem List   Diagnosis Date Noted  . Evaluation by psychiatric service required   . Ataxia   . ICH (intracerebral hemorrhage) ([place]) 05/18/2018  . Hypertensive urgency 05/18/2018    Orientation RESPIRATION BLADDER Height & Weight     Self, Time, Place  Normal Continent Weight: 170 lb 6.7 oz (77.3 kg) Height:  5\' 4"  (162.6 cm)  BEHAVIORAL SYMPTOMS/MOOD NEUROLOGICAL BOWEL NUTRITION STATUS      Continent Diet(regular)  AMBULATORY STATUS COMMUNICATION OF NEEDS Skin   Limited Assist Verbally Normal                       Personal Care Assistance Level of Assistance  Bathing, Feeding, Dressing Bathing Assistance: Limited assistance Feeding assistance: Independent Dressing Assistance: Limited assistance     Functional Limitations Info  Sight, Hearing, Speech Sight Info: Impaired(wears glasses) Hearing Info: Adequate Speech Info: Adequate    SPECIAL CARE FACTORS FREQUENCY  PT (By licensed PT), OT (By licensed OT)     PT Frequency: 5x/wk OT Frequency: 5x/wk            Contractures Contractures Info: Not present    Additional Factors Info  Code Status, Allergies Code Status Info: Full Allergies Info: NKA           Current Medications (05/21/2018):  This is the  current hospital active medication list Current Facility-Administered Medications  Medication Dose Route Frequency Provider Last Rate Last Dose  .  stroke: mapping our early stages of recovery book   Does not apply Once Cristal Ford, DO      . 0.9 %  sodium chloride infusion   Intravenous Continuous Mikhail, Velta Addison, DO      . acetaminophen (TYLENOL) tablet 650 mg  650 mg Oral Q4H PRN Cristal Ford, DO       Or  . acetaminophen (TYLENOL) solution 650 mg  650 mg Per Tube Q4H PRN Cristal Ford, DO       Or  . acetaminophen (TYLENOL) suppository 650 mg  650 mg Rectal Q4H PRN Cristal Ford, DO      . amLODipine (NORVASC) tablet 10 mg  10 mg Oral Daily Cristal Ford, DO   10 mg at 05/21/18 1020  . aspirin EC tablet 81 mg  81 mg Oral Daily Cristal Ford, DO   81 mg at 05/21/18 1021  . atorvastatin (LIPITOR) tablet 20 mg  20 mg Oral q1800 Mikhail, Velta Addison, DO      . hydrALAZINE (APRESOLINE) injection 10-20 mg  10-20 mg Intravenous Q4H PRN Cristal Ford, DO   10 mg at 05/20/18 2218  . Influenza vac split quadrivalent PF (FLUARIX) injection 0.5 mL  0.5 mL Intramuscular Tomorrow-1000 Mikhail, Blessing, DO      . metoprolol tartrate (LOPRESSOR) tablet 25 mg  25 mg Oral BID Cristal Ford, DO      . ondansetron [facility]) injection 4 mg  4 mg Intravenous Q6H PRN Cristal Ford, DO   4 mg at 05/19/18 1594  . senna-docusate (Senokot-S) tablet 1 tablet  1 tablet Oral BID Cristal Ford, DO   1 tablet at 05/21/18 1020     Discharge Medications: Please see discharge summary for a list of discharge medications.  Relevant Imaging Results:  Relevant Lab Results:   Additional Information SS#: [id number]  Geralynn Ochs, LCSW

## 2018-05-21 NOTE — Care Management Important Message (Signed)
Important Message  Patient Details  Name: JAIDON SPONSEL MRN: [id number] Date of Birth: 05-25-1956   Medicare Important Message Given:  No  Patient was not able to sign due to her illiness.  Unsigned copy left   Sharod Petsch 05/21/2018, 3:25 PM

## 2018-05-21 NOTE — Progress Notes (Signed)
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 PMHx: schizophrenia, HTN    PT Comments    Patient received exiting from restroom. Noted unsteadiness with patient reaching for walls/door frame/sink for balance. Patient ambulating in hallway with 1 HHA - declines use of RW as well as stair navigation. Reports fatigue limiting with patient demonstrating slight knee buckle when returning to room. Poor safety awareness throughout with patient reaching for objects for steadying in hallway as well as sitting quickly in recliner following OOB mobility. Will continue to recommend SNF at discharge to progress safe and independent mobility prior to return home.    Follow Up Recommendations  SNF;Supervision/Assistance - 24 hour     Equipment Recommendations  Rolling walker with 5" wheels    Recommendations for Other Services       Precautions / Restrictions Precautions Precautions: Fall Restrictions Weight Bearing Restrictions: No    Mobility  Bed Mobility               General bed mobility comments: up in restroom  Transfers Overall transfer level: Needs assistance Equipment used: 1 person hand held assist Transfers: Sit to/from Stand Sit to Stand: Min guard         General transfer comment: min guard for stand to sit at recliner - quickly sits down with poor safety awareness  Ambulation/Gait Ambulation/Gait assistance: Min guard Gait Distance (Feet): 100 Feet Assistive device: 1 person hand held assist Gait Pattern/deviations: Step-through pattern;Trunk flexed;Decreased stride length Gait velocity: decreased    General Gait Details: gaurded throughout - declines use of RW as patient states she has only used 1x before - more comofrt with 1 HHA. Patient reaching for objects in hallway  throughout session with VC to reduce. unsteadiness throughout with patient drfiting R/L   Stairs Stairs: (patient continues to deny stair training)           Wheelchair Mobility    Modified Rankin (Stroke Patients Only)       Balance Overall balance assessment: Needs assistance Sitting-balance support: No upper extremity supported Sitting balance-Leahy Scale: Good     Standing balance support: Single extremity supported;During functional activity Standing balance-Leahy Scale: Poor Standing balance comment: reliant on 1 HHA from PT; able to stand at sink and perform oral hygeine without LOB                            Cognition Arousal/Alertness: Awake/alert Behavior During Therapy: Flat affect Overall Cognitive Status: Impaired/Different from baseline Area of Impairment: Safety/judgement;Problem solving                         Safety/Judgement: Decreased awareness of safety   Problem Solving: Slow processing        Exercises      General Comments        Pertinent Vitals/Pain Pain Assessment: No/denies pain    Home Living                      Prior Function            PT Goals (current goals can now be found in the care plan section) Acute Rehab PT Goals Patient Stated Goal: know what is wrong with her. go home to mom  PT Goal Formulation: With patient Time For Goal Achievement: 06/02/18 Potential to Achieve Goals: Fair Progress towards PT goals: Progressing toward goals    Frequency    Min 3X/week      PT Plan Current plan remains appropriate    Co-evaluation              AM-PAC PT "6 Clicks" Daily Activity  Outcome Measure  Difficulty turning over in bed (including adjusting bedclothes, sheets and blankets)?: A Lot Difficulty moving from lying on back to sitting on the side of the bed? : A Lot Difficulty sitting down on and standing up from a chair with arms (e.g., wheelchair, bedside commode,  etc,.)?: A Little Help needed moving to and from a bed to chair (including a wheelchair)?: A Little Help needed walking in hospital room?: A Little Help needed climbing 3-5 steps with a railing? : A Lot 6 Click Score: 15    End of Session Equipment Utilized During Treatment: Gait belt Activity Tolerance: Patient tolerated treatment well;Patient limited by fatigue Patient left: in chair;with call bell/phone within reach;with chair alarm set;with nursing/sitter in room Nurse Communication: Mobility status PT Visit Diagnosis: Unsteadiness on feet (R26.81);Other abnormalities of gait and mobility (R26.89);Muscle weakness (generalized) (M62.81);Repeated falls (R29.6)     Time: 1100-1119 PT Time Calculation (min) (ACUTE ONLY): 19 min  Charges:  $Gait Training: 8-22 mins                     [name], PT, DPT Supplemental Physical Therapist 05/21/18 11:39 AM Pager: [phone] Office: [phone]

## 2018-05-21 NOTE — Progress Notes (Signed)
PROGRESS NOTE    Linda Lynch  WCH:[id number] DOB: September 01, 1955 DOA: 05/18/2018 PCP: Patient, No Pcp Per   Brief Narrative:  HPI on 05/18/2018 by Dr. Jennette Kettle Linda Lynch is a 62 y.o. female with medical history significant of schizophrenia.  Patient initially triaged to psychiatric evaluation based on arrival by GPDafter family called and said she was schizophrenic and has been off medications. Reportedly when patient got here she stated she was here for "personal reasons" and pointed to her genital area. On further history taking with mother (who is 75 years old), it seems that patient has been "staggering" with unsteady gait for the past week.  The patient though reports that this has been ongoing for "15-20 years". Patient denies headache.  Denies chest pain.  Does have some SOB. Patient denies any PMH, states mother has HTN.  Patient doesn't seem like the most reliable historian.  Interim history Admitted for speech and balance issues. She was found to have hypertensive emergency with elevated troponins.  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  Neurology consulted.  Also has some psychotic features, psychiatry consulted. Assessment & Plan   Gait instability/dysarthria -Unclear etiology -CT head: Small hyperdensity right cerebellum, ?  Calcification versus blood.  No evidence of large acute infarct -MRI brain: No acute intracranial abnormality.  Moderately advanced cerebral atrophy with chronic small vessel ischemic disease with multiple remote lacunar infarcts involving bilateral basal ganglia, thalami and pons.  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  No evidence associated acute or recent hemorrhage. -Echocardiogram shows an EF of 70 to 35%, grade 1 diastolic dysfunction -Carotid Doppler shows bilateral ICA 40 to 59%  stenosis.  Bilateral CCA appear diffused heterogeneous soft plaque, left CCA appears intimal thickening.  Bilateral vertebral arteries are patent with antegrade flow -Toxicology screen negative -LDL 136, hemoglobin A1c 5 -UA unremarkable  -Neurology consulted and appreciated, recommended aspirin 1 mg for stroke prevention.  Does not feel that this cavernoma is because of patient's symptoms. -CTA head/neck pending (patient continues to refuse) -PT and OT recommended SNF -Social work consulted  Hypertensive emergency/elevated troponin -Systolic blood pressures over 200 -Unclear etiology -Patient placed on oral medications however has been refusing to take them.  It does not seem that she is on medications at home. -She reports supposedly having a 10-year history of chest pain which is been unchanged. -EKG reviewed and shows no evidence of ACS -Echocardiogram as above -Continue amlodipine, IV hydralazine -troponin, peaked at 0.35, down to 0.08 -will start patient on metoprolol  History of CVA/lacunar infarcts -Noted on CT scan as well as MRI -No sequelae from these are evident, likely secondary to uncontrolled hypertension -Continue aspirin 81 mg and control blood pressure -Carotid Doppler as above  Psychiatric history -Patient does appear to have psychotic features at this time- however unknown baseline.  -Discussed with mother via phone: states she is not easy to get along with at times. She states she no longer takes any of her meds and was hospitalized in [place], [place]. She also stated that she feels that her daughter sometimes copies her and can be faking. Discussed with mother the possibility that the patient may have to return home if SNF is not an option, mother voiced understanding. -Psychiatry consulted and appreciated- discussed with Dr. Mariea Clonts, patient appears to be at baseline and would not start medications. Patient will need outpatient follow up.  Does not appear to be a harm to  self or anyone at this time and does not need inpatient psych admission.   Hypokalemia -Continue to replace and continue to monitor BMP  DVT Prophylaxis  SCDs  Code Status: Full  Family Communication: None at beside.   Disposition Plan: Admitted. Pending SNF placement and improvement in blood pressure control.   Consultants Neurology Psychiatry   Procedures  Echocardiogram Carotid doppler  Antibiotics   Anti-infectives (From admission, onward)   None      Subjective:   Linda Lynch seen and examined today.  Does not have complaints this morning. States she feels weak. Denies current chest pain, shortness of breath, abdominal pain, nausea, vomiting, diarrhea, constipation, dizziness, headache.   Objective:   Vitals:   05/21/18 0600 05/21/18 0805 05/21/18 1037 05/21/18 1220  BP: (!) 150/63 (!) 179/75 (!) 178/64 (!) 185/82  Pulse: 88 77  90  Resp: 20 20  18   Temp: 98.2 F (36.8 C) 98.2 F (36.8 C)  97.6 F (36.4 C)  TempSrc: Oral Oral  Oral  SpO2: 97% 95%  97%  Weight:      Height:        Intake/Output Summary (Last 24 hours) at 05/21/2018 1447 Last data filed at 05/21/2018 1221 Gross per 24 hour  Intake 360 ml  Output -  Net 360 ml   Filed Weights   05/19/18 0600  Weight: 77.3 kg   Exam  General: Well developed, well nourished, NAD, appears stated age  91: NCAT, mucous membranes moist.   Neck: Supple  Cardiovascular: S1 S2 auscultated, RRR, no murmur  Respiratory: Clear to auscultation bilaterally with equal chest rise  Abdomen: Soft, nontender, nondistended, + bowel sounds  Extremities: warm dry without cyanosis clubbin  Neuro: AAOx3, nonfocal  Psych: appropriate mood and affect, pleasant   Data Reviewed: I have personally reviewed following labs and imaging studies  CBC: Recent Labs  Lab 05/18/18 1716 05/20/18 0528 05/21/18 0605  WBC 10.9* 14.6* 15.8*  NEUTROABS 8.8*  --   --   HGB 16.6* 14.7 14.5  HCT 49.3* 42.8 44.7  MCV  90.1 89.9 91.6  PLT 358 328 517   Basic Metabolic Panel: Recent Labs  Lab 05/18/18 1716 05/20/18 0435 05/21/18 0605  NA 143 138 138  K 3.7 3.2* 3.4*  CL 107 103 103  CO2 27 25 23   GLUCOSE 107* 120* 107*  BUN 14 16 16   CREATININE 1.04* 1.30* 1.39*  CALCIUM 9.6 9.0 8.7*  MG  --  2.2  --    GFR: Estimated Creatinine Clearance: 41.1 mL/min (A) (by C-G formula based on SCr of 1.39 mg/dL (H)). Liver Function Tests: Recent Labs  Lab 05/18/18 1716  AST 22  ALT 18  ALKPHOS 99  BILITOT 0.7  PROT 8.2*  ALBUMIN 4.4   No results for input(s): LIPASE, AMYLASE in the last 168 hours. No results for input(s): AMMONIA in the last 168 hours. Coagulation Profile: Recent Labs  Lab 05/18/18 1716  INR 0.87   Cardiac Enzymes: Recent Labs  Lab 05/21/18 0605  TROPONINI 0.08*   BNP (last 3 results) No results for input(s): PROBNP in the last 8760 hours. HbA1C: Recent Labs    05/19/18 1031  HGBA1C 5.0   CBG: No results for input(s): GLUCAP in the last 168 hours. Lipid Profile: Recent Labs    05/19/18 1031  CHOL 195  HDL 43  LDLCALC 136*  TRIG 81  CHOLHDL 4.5   Thyroid Function Tests: No  results for input(s): TSH, T4TOTAL, FREET4, T3FREE, THYROIDAB in the last 72 hours. Anemia Panel: No results for input(s): VITAMINB12, FOLATE, FERRITIN, TIBC, IRON, RETICCTPCT in the last 72 hours. Urine analysis:    Component Value Date/Time   COLORURINE STRAW (A) 05/18/2018 1613   APPEARANCEUR CLEAR 05/18/2018 1613   LABSPEC 1.008 05/18/2018 1613   PHURINE 8.0 05/18/2018 1613   GLUCOSEU NEGATIVE 05/18/2018 1613   HGBUR NEGATIVE 05/18/2018 1613   BILIRUBINUR NEGATIVE 05/18/2018 1613   KETONESUR NEGATIVE 05/18/2018 1613   PROTEINUR >=300 (A) 05/18/2018 1613   NITRITE NEGATIVE 05/18/2018 1613   LEUKOCYTESUR SMALL (A) 05/18/2018 1613   Sepsis Labs: @LABRCNTIP (procalcitonin:4,lacticidven:4)  ) Recent Results (from the past 240 hour(s))  MRSA PCR Screening     Status: None    Collection Time: 05/19/18  6:07 AM  Result Value Ref Range Status   MRSA by PCR NEGATIVE NEGATIVE Final    Comment:        The GeneXpert MRSA Assay (FDA approved for NASAL specimens only), is one component of a comprehensive MRSA colonization surveillance program. It is not intended to diagnose MRSA infection nor to guide or monitor treatment for MRSA infections. Performed at [facility], [place] [address] [place], [place] [address]       Radiology Studies: No results found.   Scheduled Meds: .  stroke: mapping our early stages of recovery book   Does not apply Once  . amLODipine  10 mg Oral Daily  . aspirin EC  81 mg Oral Daily  . atorvastatin  20 mg Oral q1800  . Influenza vac split quadrivalent PF  0.5 mL Intramuscular Tomorrow-1000  . senna-docusate  1 tablet Oral BID   Continuous Infusions: . sodium chloride       LOS: 3 days   Time Spent in minutes   30 minutes  Linda Lynch D.O. on 05/21/2018 at 2:47 PM  Between 7am to 7pm - Please see pager noted on [email or web address]  After 7pm go to [email or web address]  And look for the night coverage person covering for me after hours  [facility] Office  [phone]

## 2018-05-22 LAB — BASIC METABOLIC PANEL
Anion gap: 11 (ref 5–15)
BUN: 19 mg/dL (ref 8–23)
CALCIUM: 8.8 mg/dL — AB (ref 8.9–10.3)
CHLORIDE: 104 mmol/L (ref 98–111)
CO2: 22 mmol/L (ref 22–32)
CREATININE: 1.3 mg/dL — AB (ref 0.44–1.00)
GFR, EST AFRICAN AMERICAN: 49 mL/min — AB (ref >60)
GFR, EST NON AFRICAN AMERICAN: 42 mL/min — AB (ref >60)
Glucose, Bld: 109 mg/dL — ABNORMAL HIGH (ref 70–99)
Potassium: 3.8 mmol/L (ref 3.5–5.1)
Sodium: 137 mmol/L (ref 135–145)

## 2018-05-22 NOTE — Progress Notes (Signed)
CSW completed paperwork to rescind patient's IVC and faxed to [facility]. Patient no longer under IVC at this time.  Laveda Abbe, [place] Clinical Social Worker [phone]

## 2018-05-22 NOTE — Clinical Social Work Note (Signed)
Clinical Social Work Assessment  Patient Details  Name: Linda Lynch MRN: [id number] Date of Birth: 1956-06-17  Date of referral:  05/21/18               Reason for consult:  Facility Placement                Permission sought to share information with:  Facility Sport and exercise psychologist, Family Supports Permission granted to share information::  Yes, Verbal Permission Granted  Name::     Journalist, newspaper::  SNF  Relationship::  Mom  Contact Information:     Housing/Transportation Living arrangements for the past 2 months:  [place] of Information:  Patient, Medical Team Patient Interpreter Needed:  None Criminal Activity/Legal Involvement Pertinent to Current Situation/Hospitalization:  No - Comment as needed Significant Relationships:  Parents Lives with:  Self, Parents Do you feel safe going back to the place where you live?  Yes Need for family participation in patient care:  No (Coment)  Care giving concerns:  Patient from home with mother, but would benefit from rehab at discharge. Patient's mother is elderly and is battling cancer, so can't provide much support for her in the home. DSS is involved and is working with the patient in the home, as well.   Social Worker assessment / plan:  CSW spoke with patient's DSS Worker, Dalbert Mayotte [phone]) who provided information on the patient's home environment. Per Camela, she would follow up with the patient and her mother in the home if the patient were to choose to discharge home and try to ensure that services are in place to assist. CSW met with patient to discuss recommendation for rehab; patient is agreeable. Patient requested time to discuss with her mother before CSW completed discharge to SNF. CSW to fax out referral and follow up.  Employment status:  Retired Forensic scientist:  Medicare PT Recommendations:  [place] / Referral to community resources:  [place]  Patient/Family's Response to care:  Patient agreeable to SNF.  Patient/Family's Understanding of and Emotional Response to Diagnosis, Current Treatment, and Prognosis:  Patient appears to understand deficits, as she acknowledged "I am pretty unsteady, I'm sorry" during discussion. Patient indicated that she thought that rehab would be a good idea and hopes to get stronger soon so that she can go back home.   Emotional Assessment Appearance:  Appears stated age Attitude/Demeanor/Rapport:  Engaged Affect (typically observed):  Pleasant, Quiet Orientation:  Oriented to Self, Oriented to Place, Oriented to  Time Alcohol / Substance use:  Not Applicable Psych involvement (Current and /or in the community):  Yes (Comment)  Discharge Needs  Concerns to be addressed:  Care Coordination Readmission within the last 30 days:  No Current discharge risk:  Cognitively Impaired, Dependent with Mobility, Psychiatric Illness Barriers to Discharge:  Continued Medical Work up, Programmer, applications ([place])   Geralynn Ochs, LCSW 05/22/2018, 9:23 AM

## 2018-05-22 NOTE — Progress Notes (Signed)
CSW following for discharge. CSW spoke with patient's mother, Stanton Kidney, earlier today about patient being recommended for SNF. [facility] appreciative of update and in agreement. Stanton Kidney will work on packing up some of the patient's clothes and asking a neighbor to deliver it, as she doesn't drive. CSW informed patient's mother about offer at [facility], and she is in agreement.  CSW also met with patient's DSS worker, [place], earlier today. CSW informed DSS worker that she will not be discharging home at this time, going for SNF. DSS worker in agreement with plan, and going to meet with patient's mother this afternoon.  Patient's PASRR is manual review, and it has gone to Level 2. Someone from [facility] will need to evaluate patient at the bedside for PASRR number, unknown when that will happen. MD is aware.  CSW to follow.  Laveda Abbe, [place] Clinical Social Worker [phone]

## 2018-05-22 NOTE — Progress Notes (Signed)
PROGRESS NOTE    Linda Lynch  YKZ:[id number] DOB: 03/01/1956 DOA: 05/18/2018 PCP: Patient, No Pcp Per   Brief Narrative:  HPI on 05/18/2018 by Dr. Jennette Kettle KAELA BEITZ is a 62 y.o. female with medical history significant of schizophrenia.  Patient initially triaged to psychiatric evaluation based on arrival by GPDafter family called and said she was schizophrenic and has been off medications. Reportedly when patient got here she stated she was here for "personal reasons" and pointed to her genital area. On further history taking with mother (who is 71 years old), it seems that patient has been "staggering" with unsteady gait for the past week.  The patient though reports that this has been ongoing for "15-20 years". Patient denies headache.  Denies chest pain.  Does have some SOB. Patient denies any PMH, states mother has HTN.  Patient doesn't seem like the most reliable historian.  Interim history Admitted for speech and balance issues. She was found to have hypertensive emergency with elevated troponins.  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  Neurology consulted.  Also has some psychotic features, psychiatry consulted. Patient calmer today. Pending SNF. Assessment & Plan   Gait instability/dysarthria -Unclear etiology -CT head: Small hyperdensity right cerebellum, ?  Calcification versus blood.  No evidence of large acute infarct -MRI brain: No acute intracranial abnormality.  Moderately advanced cerebral atrophy with chronic small vessel ischemic disease with multiple remote lacunar infarcts involving bilateral basal ganglia, thalami and pons.  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  No evidence associated acute or recent hemorrhage. -Echocardiogram shows an EF of 70 to 93%, grade 1 diastolic dysfunction -Carotid Doppler  shows bilateral ICA 40 to 59% stenosis.  Bilateral CCA appear diffused heterogeneous soft plaque, left CCA appears intimal thickening.  Bilateral vertebral arteries are patent with antegrade flow -Toxicology screen negative -LDL 136, hemoglobin A1c 5 -UA unremarkable  -Neurology consulted and appreciated, recommended aspirin 1 mg for stroke prevention.  Does not feel that this cavernoma is because of patient's symptoms. -CTA head/neck pending (patient refused) -PT and OT recommended SNF -Social work consulted  Hypertensive emergency/elevated troponin -Systolic blood pressures over 200 -Unclear etiology -Patient placed on oral medications however has been refusing to take them.  It does not seem that she is on medications at home. -She reports supposedly having a 10-year history of chest pain which is been unchanged. -EKG reviewed and shows no evidence of ACS -Echocardiogram as above -Continue amlodipine, IV hydralazine, metoprolol  -troponin, peaked at 0.35, down to 0.08  History of CVA/lacunar infarcts -Noted on CT scan as well as MRI -No sequelae from these are evident, likely secondary to uncontrolled hypertension -Continue aspirin 81 mg and control blood pressure -Carotid Doppler as above  Psychiatric history -Patient does appear to have psychotic features at this time- however unknown baseline.  -Discussed with mother via phone: states she is not easy to get along with at times. She states she no longer takes any of her meds and was hospitalized in [place], [place]. She also stated that she feels that her daughter sometimes copies her and can be faking. Discussed with mother the possibility that the patient may have to return home if SNF is not an option, mother voiced understanding. -Psychiatry consulted and appreciated- discussed with Dr. Mariea Clonts, patient appears to be at baseline and would not start medications. Patient will need outpatient follow up.  Does not appear to be a harm to self  or anyone at this time and does not need inpatient psych admission.   Hypokalemia -Continue to replace and continue to monitor BMP  DVT Prophylaxis  SCDs  Code Status: Full  Family Communication: None at beside.   Disposition Plan: Admitted. Pending SNF placement  Consultants Neurology Psychiatry   Procedures  Echocardiogram Carotid doppler  Antibiotics   Anti-infectives (From admission, onward)   None      Subjective:   Aleiya Rye seen and examined today.  No complaints this morning. States she feels weak. Denies current chest pain, shortness of breath, abdominal pain, N/VD/C, dizziness.   Objective:   Vitals:   05/21/18 2030 05/22/18 0002 05/22/18 0326 05/22/18 1127  BP: (!) 150/66 (!) 186/75 (!) 159/68 (!) 154/65  Pulse: 71 70 73 61  Resp: 17 15 18 18   Temp: 99 F (37.2 C) 98.1 F (36.7 C) 98 F (36.7 C) 98.4 F (36.9 C)  TempSrc: Oral Oral Oral Oral  SpO2: 100% 100% 100% 94%  Weight:      Height:        Intake/Output Summary (Last 24 hours) at 05/22/2018 1420 Last data filed at 05/21/2018 2250 Gross per 24 hour  Intake 340 ml  Output -  Net 340 ml   Filed Weights   05/19/18 0600  Weight: 77.3 kg   Exam  General: Well developed, well nourished, NAD, appears stated age  HEENT: NCAT, mucous membranes moist.   Neck: Supple  Cardiovascular: S1 S2 auscultated, RRR, no murmur  Respiratory: Clear to auscultation bilaterally with equal chest rise  Abdomen: Soft, nontender, nondistended, + bowel sounds  Extremities: warm dry without cyanosis clubbing  Neuro: AAOx3, nonfocal  Psych: Appropriate mood and affect, pleasant    Data Reviewed: I have personally reviewed following labs and imaging studies  CBC: Recent Labs  Lab 05/18/18 1716 05/20/18 0528 05/21/18 0605  WBC 10.9* 14.6* 15.8*  NEUTROABS 8.8*  --   --   HGB 16.6* 14.7 14.5  HCT 49.3* 42.8 44.7  MCV 90.1 89.9 91.6  PLT 358 328 462   Basic Metabolic Panel: Recent Labs    Lab 05/18/18 1716 05/20/18 0435 05/21/18 0605 05/22/18 0352  NA 143 138 138 137  K 3.7 3.2* 3.4* 3.8  CL 107 103 103 104  CO2 27 25 23 22   GLUCOSE 107* 120* 107* 109*  BUN 14 16 16 19   CREATININE 1.04* 1.30* 1.39* 1.30*  CALCIUM 9.6 9.0 8.7* 8.8*  MG  --  2.2  --   --    GFR: Estimated Creatinine Clearance: 44 mL/min (A) (by C-G formula based on SCr of 1.3 mg/dL (H)). Liver Function Tests: Recent Labs  Lab 05/18/18 1716  AST 22  ALT 18  ALKPHOS 99  BILITOT 0.7  PROT 8.2*  ALBUMIN 4.4   No results for input(s): LIPASE, AMYLASE in the last 168 hours. No results for input(s): AMMONIA in the last 168 hours. Coagulation Profile: Recent Labs  Lab 05/18/18 1716  INR 0.87   Cardiac Enzymes: Recent Labs  Lab 05/21/18 0605  TROPONINI 0.08*   BNP (last 3 results) No results for input(s): PROBNP in the last 8760 hours. HbA1C: No results for input(s): HGBA1C in the last 72 hours. CBG: No results for input(s): GLUCAP in the last 168 hours. Lipid Profile: No results for input(s): CHOL, HDL, LDLCALC, TRIG, CHOLHDL, LDLDIRECT in the last 72 hours. Thyroid Function Tests: No results for input(s): TSH, T4TOTAL, FREET4,  T3FREE, THYROIDAB in the last 72 hours. Anemia Panel: No results for input(s): VITAMINB12, FOLATE, FERRITIN, TIBC, IRON, RETICCTPCT in the last 72 hours. Urine analysis:    Component Value Date/Time   COLORURINE STRAW (A) 05/18/2018 1613   APPEARANCEUR CLEAR 05/18/2018 1613   LABSPEC 1.008 05/18/2018 1613   PHURINE 8.0 05/18/2018 1613   GLUCOSEU NEGATIVE 05/18/2018 1613   HGBUR NEGATIVE 05/18/2018 1613   BILIRUBINUR NEGATIVE 05/18/2018 1613   KETONESUR NEGATIVE 05/18/2018 1613   PROTEINUR >=300 (A) 05/18/2018 1613   NITRITE NEGATIVE 05/18/2018 1613   LEUKOCYTESUR SMALL (A) 05/18/2018 1613   Sepsis Labs: @LABRCNTIP (procalcitonin:4,lacticidven:4)  ) Recent Results (from the past 240 hour(s))  MRSA PCR Screening     Status: None   Collection Time:  05/19/18  6:07 AM  Result Value Ref Range Status   MRSA by PCR NEGATIVE NEGATIVE Final    Comment:        The GeneXpert MRSA Assay (FDA approved for NASAL specimens only), is one component of a comprehensive MRSA colonization surveillance program. It is not intended to diagnose MRSA infection nor to guide or monitor treatment for MRSA infections. Performed at [facility], [place] [address] [place], [place] [address]       Radiology Studies: No results found.   Scheduled Meds: .  stroke: mapping our early stages of recovery book   Does not apply Once  . amLODipine  10 mg Oral Daily  . aspirin EC  81 mg Oral Daily  . atorvastatin  20 mg Oral q1800  . Influenza vac split quadrivalent PF  0.5 mL Intramuscular Tomorrow-1000  . metoprolol tartrate  25 mg Oral BID  . senna-docusate  1 tablet Oral BID   Continuous Infusions: . sodium chloride       LOS: 4 days   Time Spent in minutes   30 minutes  Javad Salva D.O. on 05/22/2018 at 2:20 PM  Between 7am to 7pm - Please see pager noted on [email or web address]  After 7pm go to [email or web address]  And look for the night coverage person covering for me after hours  [facility] Office  [phone]

## 2018-05-22 NOTE — Plan of Care (Signed)
Patient stable, discussed POC with patient, denies question/concerns at this time.  

## 2018-05-22 NOTE — Care Management Note (Signed)
Case Management Note  Patient Details  Name: KATHERYN CULLITON MRN: [id number] Date of Birth: 1956-05-24  Subjective/Objective:   Pt admitted with weakness and for psychiatric evaluation.  Pt with a history of schizophrenia. She is from home with her mother.                 Action/Plan: Plan is for SNF. Awaiting PASAR number for patient to be able to d/c to SNF. No further needs per CM.   Expected Discharge Date:                  Expected Discharge Plan:  Skilled Nursing Facility  In-House Referral:  Clinical Social Work  Discharge planning Services     Post Acute Care Choice:    Choice offered to:     DME Arranged:    DME Agency:     HH Arranged:    [place] Agency:     Status of Service:  In process, will continue to follow  If discussed at Long Length of Stay Meetings, dates discussed:    Additional Comments:  Pollie Friar, RN 05/22/2018, 1:02 PM

## 2018-05-23 DIAGNOSIS — F99 Mental disorder, not otherwise specified: Secondary | ICD-10-CM

## 2018-05-23 LAB — CBC
HCT: 41.1 % (ref 36.0–46.0)
HEMOGLOBIN: 13.1 g/dL (ref 12.0–15.0)
MCH: 29.7 pg (ref 26.0–34.0)
MCHC: 31.9 g/dL (ref 30.0–36.0)
MCV: 93.2 fL (ref 80.0–100.0)
Platelets: 302 10*3/uL (ref 150–400)
RBC: 4.41 MIL/uL (ref 3.87–5.11)
RDW: 12.2 % (ref 11.5–15.5)
WBC: 12.8 10*3/uL — AB (ref 4.0–10.5)
nRBC: 0 % (ref 0.0–0.2)

## 2018-05-23 LAB — BASIC METABOLIC PANEL
Anion gap: 8 (ref 5–15)
BUN: 23 mg/dL (ref 8–23)
CO2: 25 mmol/L (ref 22–32)
CREATININE: 1.17 mg/dL — AB (ref 0.44–1.00)
Calcium: 8.7 mg/dL — ABNORMAL LOW (ref 8.9–10.3)
Chloride: 106 mmol/L (ref 98–111)
GFR calc non Af Amer: 48 mL/min — ABNORMAL LOW (ref >60)
GFR, EST AFRICAN AMERICAN: 56 mL/min — AB (ref >60)
Glucose, Bld: 96 mg/dL (ref 70–99)
Potassium: 3.5 mmol/L (ref 3.5–5.1)
SODIUM: 139 mmol/L (ref 135–145)

## 2018-05-23 MED ORDER — LORAZEPAM 2 MG/ML IJ SOLN
1.0000 mg | Freq: Once | INTRAMUSCULAR | Status: AC
  Administered 2018-05-23: 1 mg via INTRAVENOUS
  Filled 2018-05-23: qty 1

## 2018-05-23 NOTE — Progress Notes (Signed)
PROGRESS NOTE    Linda Lynch  RKY:[id number] DOB: 03/18/1956 DOA: 05/18/2018 PCP: Patient, No Pcp Per   Brief Narrative:  HPI on 05/18/2018 by Dr. Jennette Kettle Linda Lynch is a 62 y.o. female with medical history significant of schizophrenia.  Patient initially triaged to psychiatric evaluation based on arrival by GPDafter family called and said she was schizophrenic and has been off medications. Reportedly when patient got here she stated she was here for "personal reasons" and pointed to her genital area. On further history taking with mother (who is 69 years old), it seems that patient has been "staggering" with unsteady gait for the past week.  The patient though reports that this has been ongoing for "15-20 years". Patient denies headache.  Denies chest pain.  Does have some SOB. Patient denies any PMH, states mother has HTN.  Patient doesn't seem like the most reliable historian.  Interim history Admitted for speech and balance issues. She was found to have hypertensive emergency with elevated troponins.  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  Neurology consulted.  Also has some psychotic features, psychiatry consulted. Patient calmer today. Pending SNF. Assessment & Plan   Gait instability/dysarthria -Unclear etiology -CT head: Small hyperdensity right cerebellum, ?  Calcification versus blood.  No evidence of large acute infarct -MRI brain: No acute intracranial abnormality.  Moderately advanced cerebral atrophy with chronic small vessel ischemic disease with multiple remote lacunar infarcts involving bilateral basal ganglia, thalami and pons.  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  No evidence associated acute or recent hemorrhage. -Echocardiogram shows an EF of 70 to 31%, grade 1 diastolic dysfunction -Carotid Doppler  shows bilateral ICA 40 to 59% stenosis.  Bilateral CCA appear diffused heterogeneous soft plaque, left CCA appears intimal thickening.  Bilateral vertebral arteries are patent with antegrade flow -Toxicology screen negative -LDL 136, hemoglobin A1c 5 -UA unremarkable  -Neurology consulted and appreciated, recommended aspirin 1 mg for stroke prevention.  Does not feel that this cavernoma is because of patient's symptoms. -CTA head/neck pending (patient refused) -PT and OT recommended SNF -Social work consulted  Hypertensive emergency/elevated troponin -Systolic blood pressures over 200 -Unclear etiology -Patient placed on oral medications however has been refusing to take them.  It does not seem that she is on medications at home. -She reports supposedly having a 10-year history of chest pain which is been unchanged. -EKG reviewed and shows no evidence of ACS -Echocardiogram as above -Continue amlodipine, IV hydralazine, metoprolol  -troponin, peaked at 0.35, down to 0.08 -BP better controlled, currently 144/88  History of CVA/lacunar infarcts -Noted on CT scan as well as MRI -No sequelae from these are evident, likely secondary to uncontrolled hypertension -Continue aspirin 81 mg and control blood pressure -Carotid Doppler as above  Psychiatric history -Patient does appear to have psychotic features at this time- however unknown baseline.  -Discussed with mother via phone: states she is not easy to get along with at times. She states she no longer takes any of her meds and was hospitalized in [place], [place]. She also stated that she feels that her daughter sometimes copies her and can be faking. Discussed with mother the possibility that the patient may have to return home if SNF is not an option, mother voiced understanding. -Psychiatry consulted and appreciated- discussed with Dr. Mariea Clonts, patient appears to be at baseline and would not start medications. Patient  will need outpatient follow  up. Does not appear to be a harm to self or anyone at this time and does not need inpatient psych admission.   Hypokalemia -Resolved with replacement  DVT Prophylaxis  SCDs  Code Status: Full  Family Communication: None at beside.   Disposition Plan: Admitted. Pending SNF placement  Consultants Neurology Psychiatry   Procedures  Echocardiogram Carotid doppler  Antibiotics   Anti-infectives (From admission, onward)   None      Subjective:   Linda Lynch seen and examined today.  No complaints today. Feeling sleepy. Denies chest pain, shortness of breath, abdominal pain, N/V/D/C.   Objective:   Vitals:   05/22/18 1935 05/22/18 2333 05/23/18 0332 05/23/18 0807  BP: (!) 174/71 (!) 173/72 (!) 171/62 (!) 144/88  Pulse: 67 60 66 70  Resp: 18 18 18 20   Temp: 97.7 F (36.5 C) 98 F (36.7 C) 97.8 F (36.6 C) 97.9 F (36.6 C)  TempSrc: Oral Oral Oral Oral  SpO2: 97% 97% 97% 98%  Weight:      Height:        Intake/Output Summary (Last 24 hours) at 05/23/2018 1012 Last data filed at 05/23/2018 0945 Gross per 24 hour  Intake 240 ml  Output -  Net 240 ml   Filed Weights   05/19/18 0600  Weight: 77.3 kg   Exam  General: Well developed, well nourished, NAD, appears stated age  68: NCAT, mucous membranes moist.   Neck: Supple  Cardiovascular: S1 S2 auscultated, no murmurs, RRR  Respiratory: Clear to auscultation bilaterally with equal chest rise  Abdomen: Soft, nontender, nondistended, + bowel sounds  Extremities: warm dry without cyanosis clubbing or edema  Neuro: AAOx3, nonfocal  Psych: Appropriate mood and affect  Data Reviewed: I have personally reviewed following labs and imaging studies  CBC: Recent Labs  Lab 05/18/18 1716 05/20/18 0528 05/21/18 0605 05/23/18 0417  WBC 10.9* 14.6* 15.8* 12.8*  NEUTROABS 8.8*  --   --   --   HGB 16.6* 14.7 14.5 13.1  HCT 49.3* 42.8 44.7 41.1  MCV 90.1 89.9 91.6 93.2  PLT 358 328 335 947   Basic  Metabolic Panel: Recent Labs  Lab 05/18/18 1716 05/20/18 0435 05/21/18 0605 05/22/18 0352 05/23/18 0417  NA 143 138 138 137 139  K 3.7 3.2* 3.4* 3.8 3.5  CL 107 103 103 104 106  CO2 27 25 23 22 25   GLUCOSE 107* 120* 107* 109* 96  BUN 14 16 16 19 23   CREATININE 1.04* 1.30* 1.39* 1.30* 1.17*  CALCIUM 9.6 9.0 8.7* 8.8* 8.7*  MG  --  2.2  --   --   --    GFR: Estimated Creatinine Clearance: 48.8 mL/min (A) (by C-G formula based on SCr of 1.17 mg/dL (H)). Liver Function Tests: Recent Labs  Lab 05/18/18 1716  AST 22  ALT 18  ALKPHOS 99  BILITOT 0.7  PROT 8.2*  ALBUMIN 4.4   No results for input(s): LIPASE, AMYLASE in the last 168 hours. No results for input(s): AMMONIA in the last 168 hours. Coagulation Profile: Recent Labs  Lab 05/18/18 1716  INR 0.87   Cardiac Enzymes: Recent Labs  Lab 05/21/18 0605  TROPONINI 0.08*   BNP (last 3 results) No results for input(s): PROBNP in the last 8760 hours. HbA1C: No results for input(s): HGBA1C in the last 72 hours. CBG: No results for input(s): GLUCAP in the last 168 hours. Lipid Profile: No results for input(s): CHOL, HDL, LDLCALC, TRIG, CHOLHDL,  LDLDIRECT in the last 72 hours. Thyroid Function Tests: No results for input(s): TSH, T4TOTAL, FREET4, T3FREE, THYROIDAB in the last 72 hours. Anemia Panel: No results for input(s): VITAMINB12, FOLATE, FERRITIN, TIBC, IRON, RETICCTPCT in the last 72 hours. Urine analysis:    Component Value Date/Time   COLORURINE STRAW (A) 05/18/2018 1613   APPEARANCEUR CLEAR 05/18/2018 1613   LABSPEC 1.008 05/18/2018 1613   PHURINE 8.0 05/18/2018 1613   GLUCOSEU NEGATIVE 05/18/2018 1613   HGBUR NEGATIVE 05/18/2018 1613   BILIRUBINUR NEGATIVE 05/18/2018 1613   KETONESUR NEGATIVE 05/18/2018 1613   PROTEINUR >=300 (A) 05/18/2018 1613   NITRITE NEGATIVE 05/18/2018 1613   LEUKOCYTESUR SMALL (A) 05/18/2018 1613   Sepsis Labs: @LABRCNTIP (procalcitonin:4,lacticidven:4)  ) Recent Results  (from the past 240 hour(s))  MRSA PCR Screening     Status: None   Collection Time: 05/19/18  6:07 AM  Result Value Ref Range Status   MRSA by PCR NEGATIVE NEGATIVE Final    Comment:        The GeneXpert MRSA Assay (FDA approved for NASAL specimens only), is one component of a comprehensive MRSA colonization surveillance program. It is not intended to diagnose MRSA infection nor to guide or monitor treatment for MRSA infections. Performed at [facility], [place] [address] [place], [place] [address]       Radiology Studies: No results found.   Scheduled Meds: .  stroke: mapping our early stages of recovery book   Does not apply Once  . amLODipine  10 mg Oral Daily  . aspirin EC  81 mg Oral Daily  . atorvastatin  20 mg Oral q1800  . Influenza vac split quadrivalent PF  0.5 mL Intramuscular Tomorrow-1000  . metoprolol tartrate  25 mg Oral BID  . senna-docusate  1 tablet Oral BID   Continuous Infusions: . sodium chloride       LOS: 5 days   Time Spent in minutes   30 minutes  Toddy Boyd D.O. on 05/23/2018 at 10:12 AM  Between 7am to 7pm - Please see pager noted on [email or web address]  After 7pm go to [email or web address]  And look for the night coverage person covering for me after hours  [facility] Office  [phone]

## 2018-05-23 NOTE — Progress Notes (Signed)
Call received from staffing, inquiring if sitter order had been discontinued. Paged on call. Will await call back.

## 2018-05-24 LAB — BASIC METABOLIC PANEL
ANION GAP: 8 (ref 5–15)
BUN: 17 mg/dL (ref 8–23)
CHLORIDE: 110 mmol/L (ref 98–111)
CO2: 21 mmol/L — AB (ref 22–32)
CREATININE: 0.97 mg/dL (ref 0.44–1.00)
Calcium: 8.4 mg/dL — ABNORMAL LOW (ref 8.9–10.3)
GFR calc Af Amer: >60 mL/min (ref >60)
GFR calc non Af Amer: >60 mL/min (ref >60)
Glucose, Bld: 104 mg/dL — ABNORMAL HIGH (ref 70–99)
POTASSIUM: 4 mmol/L (ref 3.5–5.1)
Sodium: 139 mmol/L (ref 135–145)

## 2018-05-24 NOTE — Progress Notes (Signed)
PROGRESS NOTE    Linda Lynch  XTK:[id number] DOB: [birth year] DOA: 05/18/2018 PCP: Patient, No Pcp Per   Brief Narrative:  HPI on 05/18/2018 by Dr. Jennette Kettle Linda Lynch is a 62 y.o. female with medical history significant of schizophrenia.  Patient initially triaged to psychiatric evaluation based on arrival by GPDafter family called and said she was schizophrenic and has been off medications. Reportedly when patient got here she stated she was here for "personal reasons" and pointed to her genital area. On further history taking with mother (who is 66 years old), it seems that patient has been "staggering" with unsteady gait for the past week.  The patient though reports that this has been ongoing for "15-20 years". Patient denies headache.  Denies chest pain.  Does have some SOB. Patient denies any PMH, states mother has HTN.  Patient doesn't seem like the most reliable historian.  Interim history Admitted for speech and balance issues. She was found to have hypertensive emergency with elevated troponins.  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  Neurology consulted.  Also has some psychotic features, psychiatry consulted. Patient calmer today. Pending SNF. Assessment & Plan   Gait instability/dysarthria -Unclear etiology -CT head: Small hyperdensity right cerebellum, ?  Calcification versus blood.  No evidence of large acute infarct -MRI brain: No acute intracranial abnormality.  Moderately advanced cerebral atrophy with chronic small vessel ischemic disease with multiple remote lacunar infarcts involving bilateral basal ganglia, thalami and pons.  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  No evidence associated acute or recent hemorrhage. -Echocardiogram shows an EF of 70 to 99%, grade 1 diastolic dysfunction -Carotid Doppler  shows bilateral ICA 40 to 59% stenosis.  Bilateral CCA appear diffused heterogeneous soft plaque, left CCA appears intimal thickening.  Bilateral vertebral arteries are patent with antegrade flow -Toxicology screen negative -LDL 136, hemoglobin A1c 5 -UA unremarkable  -Neurology consulted and appreciated, recommended aspirin 1 mg for stroke prevention.  Does not feel that this cavernoma is because of patient's symptoms. -CTA head/neck pending (patient refused) -PT and OT recommended SNF -Social work consulted  Hypertensive emergency/elevated troponin -Systolic blood pressures over 200 -Unclear etiology -Patient placed on oral medications however has been refusing to take them.  It does not seem that she is on medications at home. -She reports supposedly having a 10-year history of chest pain which is been unchanged. -EKG reviewed and shows no evidence of ACS -Echocardiogram as above -Continue amlodipine, IV hydralazine, metoprolol  -troponin, peaked at 0.35, down to 0.08  History of CVA/lacunar infarcts -Noted on CT scan as well as MRI -No sequelae from these are evident, likely secondary to uncontrolled hypertension -Continue aspirin 81 mg and control blood pressure -Carotid Doppler as above  Psychiatric history -Patient does appear to have psychotic features at this time- however unknown baseline.  -Discussed with mother via phone: states she is not easy to get along with at times. She states she no longer takes any of her meds and was hospitalized in [place], [place]. She also stated that she feels that her daughter sometimes copies her and can be faking. Discussed with mother the possibility that the patient may have to return home if SNF is not an option, mother voiced understanding. -Psychiatry consulted and appreciated- discussed with Dr. Mariea Clonts, patient appears to be at baseline and would not start medications. Patient will need outpatient follow up.  Does not appear to be a harm to self  or anyone at this time and does not need inpatient psych admission.   Hypokalemia -Resolved with replacement; continue to monitor BMP   Acute kidney injury -Resolved, creatinine currently 0.97 (was 1.3 on admission- peaked to 1.39) -Continue to monitor BMP  DVT Prophylaxis  SCDs  Code Status: Full  Family Communication: None at beside.   Disposition Plan: Admitted. Pending SNF placement  Consultants Neurology Psychiatry   Procedures  Echocardiogram Carotid doppler  Antibiotics   Anti-infectives (From admission, onward)   None      Subjective:   Linda Lynch seen and examined today. Tearful today. Feels like she is not doing anything. Denies chest pain, shortness of breath, abdominal pain. Feels weak.   Objective:   Vitals:   05/23/18 1621 05/23/18 1938 05/23/18 2330 05/24/18 0327  BP: (!) 148/62 (!) 182/73 (!) 156/75 (!) 186/76  Pulse: 72 70 75 70  Resp: 18 18 18 18   Temp: 98 F (36.7 C) 97.9 F (36.6 C) 98 F (36.7 C) 98.3 F (36.8 C)  TempSrc: Oral Oral Oral Oral  SpO2: 100% 98% 97% 98%  Weight:      Height:        Intake/Output Summary (Last 24 hours) at 05/24/2018 1015 Last data filed at 05/24/2018 0400 Gross per 24 hour  Intake 1140 ml  Output 300 ml  Net 840 ml   Filed Weights   05/19/18 0600  Weight: 77.3 kg   Exam  General: Well developed, well nourished, NAD, appears stated age  HEENT: NCAT, mucous membranes moist.   Neck: Supple  Cardiovascular: S1 S2 auscultated, RRR, no murmur  Respiratory: Clear to auscultation bilaterally with equal chest rise  Abdomen: Soft, nontender, nondistended, + bowel sounds  Extremities: warm dry without cyanosis clubbing or edema  Neuro: AAOx3, nonfocal  Psych: Tearful  Data Reviewed: I have personally reviewed following labs and imaging studies  CBC: Recent Labs  Lab 05/18/18 1716 05/20/18 0528 05/21/18 0605 05/23/18 0417  WBC 10.9* 14.6* 15.8* 12.8*  NEUTROABS 8.8*  --   --   --     HGB 16.6* 14.7 14.5 13.1  HCT 49.3* 42.8 44.7 41.1  MCV 90.1 89.9 91.6 93.2  PLT 358 328 335 503   Basic Metabolic Panel: Recent Labs  Lab 05/20/18 0435 05/21/18 0605 05/22/18 0352 05/23/18 0417 05/24/18 0500  NA 138 138 137 139 139  K 3.2* 3.4* 3.8 3.5 4.0  CL 103 103 104 106 110  CO2 25 23 22 25  21*  GLUCOSE 120* 107* 109* 96 104*  BUN 16 16 19 23 17   CREATININE 1.30* 1.39* 1.30* 1.17* 0.97  CALCIUM 9.0 8.7* 8.8* 8.7* 8.4*  MG 2.2  --   --   --   --    GFR: Estimated Creatinine Clearance: 58.9 mL/min (by C-G formula based on SCr of 0.97 mg/dL). Liver Function Tests: Recent Labs  Lab 05/18/18 1716  AST 22  ALT 18  ALKPHOS 99  BILITOT 0.7  PROT 8.2*  ALBUMIN 4.4   No results for input(s): LIPASE, AMYLASE in the last 168 hours. No results for input(s): AMMONIA in the last 168 hours. Coagulation Profile: Recent Labs  Lab 05/18/18 1716  INR 0.87   Cardiac Enzymes: Recent Labs  Lab 05/21/18 0605  TROPONINI 0.08*   BNP (last 3 results) No results for input(s): PROBNP in the last 8760 hours. HbA1C: No results for input(s): HGBA1C in the last 72 hours. CBG: No  results for input(s): GLUCAP in the last 168 hours. Lipid Profile: No results for input(s): CHOL, HDL, LDLCALC, TRIG, CHOLHDL, LDLDIRECT in the last 72 hours. Thyroid Function Tests: No results for input(s): TSH, T4TOTAL, FREET4, T3FREE, THYROIDAB in the last 72 hours. Anemia Panel: No results for input(s): VITAMINB12, FOLATE, FERRITIN, TIBC, IRON, RETICCTPCT in the last 72 hours. Urine analysis:    Component Value Date/Time   COLORURINE STRAW (A) 05/18/2018 1613   APPEARANCEUR CLEAR 05/18/2018 1613   LABSPEC 1.008 05/18/2018 1613   PHURINE 8.0 05/18/2018 1613   GLUCOSEU NEGATIVE 05/18/2018 1613   HGBUR NEGATIVE 05/18/2018 1613   BILIRUBINUR NEGATIVE 05/18/2018 1613   KETONESUR NEGATIVE 05/18/2018 1613   PROTEINUR >=300 (A) 05/18/2018 1613   NITRITE NEGATIVE 05/18/2018 1613   LEUKOCYTESUR  SMALL (A) 05/18/2018 1613   Sepsis Labs: @LABRCNTIP (procalcitonin:4,lacticidven:4)  ) Recent Results (from the past 240 hour(s))  MRSA PCR Screening     Status: None   Collection Time: 05/19/18  6:07 AM  Result Value Ref Range Status   MRSA by PCR NEGATIVE NEGATIVE Final    Comment:        The GeneXpert MRSA Assay (FDA approved for NASAL specimens only), is one component of a comprehensive MRSA colonization surveillance program. It is not intended to diagnose MRSA infection nor to guide or monitor treatment for MRSA infections. Performed at [facility], [place] [address] [place], [place] [address]       Radiology Studies: No results found.   Scheduled Meds: .  stroke: mapping our early stages of recovery book   Does not apply Once  . amLODipine  10 mg Oral Daily  . aspirin EC  81 mg Oral Daily  . atorvastatin  20 mg Oral q1800  . Influenza vac split quadrivalent PF  0.5 mL Intramuscular Tomorrow-1000  . metoprolol tartrate  25 mg Oral BID  . senna-docusate  1 tablet Oral BID   Continuous Infusions: . sodium chloride       LOS: 6 days   Time Spent in minutes   30 minutes  Linda Lynch D.O. on 05/24/2018 at 10:15 AM  Between 7am to 7pm - Please see pager noted on [email or web address]  After 7pm go to [email or web address]  And look for the night coverage person covering for me after hours  [facility] Office  [phone]

## 2018-05-25 NOTE — Progress Notes (Signed)
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 PMHx: schizophrenia, HTN   OT comments  Pt with slow progress towards OT goals. Pt performing room level mobility using RW with minA, completing grooming ADLs seated EOB with setup/minguard assist. Pt continues to demonstrate flat affect and with slow processing, initiation of functional tasks. Pt easily fatigued with minimal activity. Feel SNF recommendation remains appropriate at this time. Will continue to follow acutely to progress pt towards established OT goals.   Follow Up Recommendations  SNF    Equipment Recommendations  Other (comment)(TBD)          Precautions / Restrictions Precautions Precautions: Fall Restrictions Weight Bearing Restrictions: No       Mobility Bed Mobility Overal bed mobility: Needs Assistance Bed Mobility: Supine to Sit     Supine to sit: Min assist     General bed mobility comments: minA initially to support trunk, pt transitioning into long sitting, cues to initiate transition to sitting EOB  Transfers Overall transfer level: Needs assistance Equipment used: 1 person hand held assist;Rolling walker (2 wheeled) Transfers: Sit to/from Stand Sit to Stand: Min assist         General transfer comment: assist to boost from EOB and steady in standing; pt stood x2 from EOB    Balance Overall balance assessment: Needs assistance Sitting-balance support: Feet supported;No upper extremity supported Sitting balance-Leahy Scale: Good     Standing balance support: Single extremity supported;Bilateral upper extremity supported;During functional activity Standing balance-Leahy Scale: Poor Standing balance comment: reliant on UE support, external assist from therapist                            ADL either performed or assessed with clinical judgement   ADL Overall ADL's : Needs assistance/impaired Eating/Feeding: Set up;Sitting Eating/Feeding Details (indicate cue type and reason): to open containers Grooming: Wash/dry hands;Sitting;Set up;Min guard Grooming Details (indicate cue type and reason): setup seated EOB             Lower Body Dressing: Minimal assistance;Sit to/from stand Lower Body Dressing Details (indicate cue type and reason): pt adjusting socks using "circle sitting" position while in bed prior to transfer to sitting EOB; minA standing balance             Functional mobility during ADLs: Minimal assistance;Rolling walker General ADL Comments: Pt slow to initiate ADL and mobility tasks; initially performed sit<>stand from EOB without AD and pt reaching forward for sink to stabilize, and overall appearing fatigued. Returned to sitting EOB and utilized RW for increased stability, pt able to ambulate around EOB and setup for breakfast                        Cognition Arousal/Alertness: Awake/alert Behavior During Therapy: Flat affect Overall Cognitive Status: Impaired/Different from baseline Area of Impairment: Safety/judgement;Problem solving;Awareness;Following commands                     Memory: Decreased short-term memory Following Commands: Follows one step commands with increased time Safety/Judgement: Decreased awareness of safety Awareness: Emergent Problem Solving: Slow processing;Decreased initiation;Requires verbal cues;Requires tactile cues General Comments: pt slow to process instructions, initiate tasks, and respond to questions        Exercises  Shoulder Instructions       General Comments      Pertinent Vitals/ Pain       Pain Assessment: Faces Faces Pain Scale: Hurts even more Pain Location: RUE at IV site Pain Descriptors / Indicators: Grimacing;Guarding Pain Intervention(s): Limited activity within  patient's tolerance;Monitored during session  Home Living                                          Prior Functioning/Environment              Frequency  Min 2X/week        Progress Toward Goals  OT Goals(current goals can now be found in the care plan section)  Progress towards OT goals: Progressing toward goals  Acute Rehab OT Goals Patient Stated Goal: none stated this session OT Goal Formulation: With patient Time For Goal Achievement: 06/02/18 Potential to Achieve Goals: Good ADL Goals Additional ADL Goal #1: Pt will walk to toilet with walker and do all toieting with modified independence. Additional ADL Goal #2: Pt will gather all clothing needed to dress with walker and fully dress self with supervision. Additional ADL Goal #3: Pt will answer 5/5 safey questions for home with 100% accurace and no assist.  Plan Discharge plan remains appropriate    Co-evaluation                 AM-PAC PT "6 Clicks" Daily Activity     Outcome Measure   Help from another person eating meals?: None Help from another person taking care of personal grooming?: A Little Help from another person toileting, which includes using toliet, bedpan, or urinal?: A Little Help from another person bathing (including washing, rinsing, drying)?: A Little Help from another person to put on and taking off regular upper body clothing?: None Help from another person to put on and taking off regular lower body clothing?: A Little 6 Click Score: 20    End of Session Equipment Utilized During Treatment: Rolling walker;Gait belt  OT Visit Diagnosis: Unsteadiness on feet (R26.81);Ataxia, unspecified (R27.0);Cognitive communication deficit (R41.841) Symptoms and signs involving cognitive functions: Other Nontraumatic ICH   Activity Tolerance Patient tolerated treatment well   Patient Left in chair;with call bell/phone within reach;with chair alarm set   Nurse Communication  Mobility status        Time: 7517-0017 OT Time Calculation (min): 22 min  Charges: OT General Charges $OT Visit: 1 Visit OT Treatments $Self Care/Home Management : 8-22 mins  Lou Cal, [place] Pager [phone] Office [phone]    Raymondo Band 05/25/2018, 9:55 AM

## 2018-05-25 NOTE — Progress Notes (Signed)
CSW rec'd phone call-Patient's PASRR is manual review, and it has gone to Level 2. Someone with [facility] will meet with the patient  today at bedside for evaluation.   Thurmond Butts, [place] Social Worker [phone]

## 2018-05-25 NOTE — Progress Notes (Signed)
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 PMHx: schizophrenia, HTN    PT Comments    Pt performed gait training with use AD.  She remains unsteady and required cues to negotiate obstacles.  Pt showing fatigue mid way through gt training and she required rest break in standing.  Pt placed elbows on RW and required educated to avoid this position as it was not safe.  Post gait training patient performed LE and UE exercises in seated position.  Pt continues to require assistance to mobilize and continues to benefit from skilled rehab at SNF to improve strength and function before returning home.      Follow Up Recommendations  SNF;Supervision/Assistance - 24 hour     Equipment Recommendations  Rolling walker with 5" wheels    Recommendations for Other Services       Precautions / Restrictions Precautions Precautions: Fall Restrictions Weight Bearing Restrictions: No    Mobility  Bed Mobility Overal bed mobility: Needs Assistance Bed Mobility: Supine to Sit     Supine to sit: Min assist     General bed mobility comments: Pt seated in recliner on arrival with feet in dependent position  Transfers Overall transfer level: Needs assistance Equipment used: Rolling walker (2 wheeled) Transfers: Sit to/from Stand Sit to Stand: Min guard         General transfer comment: Cues for hand placement to and from seated surface.  pt initially reaching on RW to pull into standing.    Ambulation/Gait Ambulation/Gait assistance: Min assist Gait Distance (Feet): 100 Feet(required brief rest break 50 ft due to fatigue and rested elbows on her RW.  ) Assistive device: Rolling walker (2 wheeled) Gait Pattern/deviations: Step-through pattern;Trunk flexed;Decreased stride length Gait velocity:  decreased    General Gait Details: Cues for stepping closer to device, assistance to negotiate obstacles in halls.  Pt required cues for forward gaze. Pt presents with poor safety awareness and decreased activity tolerance.    Stairs             Wheelchair Mobility    Modified Rankin (Stroke Patients Only)       Balance Overall balance assessment: Needs assistance Sitting-balance support: Feet supported;No upper extremity supported Sitting balance-Leahy Scale: Good Sitting balance - Comments: pt in circle sitting on EOB on arrival, eating.    Standing balance support: Single extremity supported;Bilateral upper extremity supported;During functional activity Standing balance-Leahy Scale: Poor Standing balance comment: reliant on UE support, external assist from therapist                            Cognition Arousal/Alertness: Awake/alert Behavior During Therapy: Flat affect Overall Cognitive Status: Impaired/Different from baseline Area of Impairment: Safety/judgement;Problem solving;Awareness;Following commands                     Memory: Decreased short-term memory Following Commands: Follows one step commands with increased time Safety/Judgement: Decreased awareness of safety Awareness: Emergent Problem Solving: Slow processing;Decreased initiation;Requires verbal cues;Requires tactile cues General Comments: pt slow to process instructions, initiate tasks, and respond to questions      Exercises General Exercises - Upper Extremity Shoulder Flexion: AROM;Both;10 reps;Seated Shoulder Extension: AROM;Both;Seated;5 reps Elbow Flexion: AROM;Both;10 reps;Seated Elbow Extension: AROM;Both;10 reps;Seated General Exercises - Lower Extremity Ankle Circles/Pumps: AROM;Both;10 reps;Seated  Long Arc Quad: AROM;Both;10 reps;Seated Hip Flexion/Marching: AROM;Both;10 reps;Seated    General Comments        Pertinent Vitals/Pain Pain Assessment:  Faces Faces Pain Scale: Hurts even more Pain Location: RUE at IV site Pain Descriptors / Indicators: Grimacing;Guarding Pain Intervention(s): Monitored during session;Repositioned    Home Living                      Prior Function            PT Goals (current goals can now be found in the care plan section) Acute Rehab PT Goals Patient Stated Goal: none stated this session Potential to Achieve Goals: Fair Progress towards PT goals: Progressing toward goals    Frequency    Min 3X/week      PT Plan Current plan remains appropriate    Co-evaluation              AM-PAC PT "6 Clicks" Daily Activity  Outcome Measure  Difficulty turning over in bed (including adjusting bedclothes, sheets and blankets)?: A Lot Difficulty moving from lying on back to sitting on the side of the bed? : A Lot Difficulty sitting down on and standing up from a chair with arms (e.g., wheelchair, bedside commode, etc,.)?: Unable Help needed moving to and from a bed to chair (including a wheelchair)?: A Little Help needed walking in hospital room?: A Little Help needed climbing 3-5 steps with a railing? : A Lot 6 Click Score: 13    End of Session Equipment Utilized During Treatment: Gait belt Activity Tolerance: Patient tolerated treatment well;Patient limited by fatigue Patient left: in chair;with call bell/phone within reach;with chair alarm set;with nursing/sitter in room(refused to recline feet.  ) Nurse Communication: Mobility status PT Visit Diagnosis: Unsteadiness on feet (R26.81);Other abnormalities of gait and mobility (R26.89);Muscle weakness (generalized) (M62.81);Repeated falls (R29.6)     Time: 1050-1108 PT Time Calculation (min) (ACUTE ONLY): 18 min  Charges:  $Gait Training: 8-22 mins                     Governor Rooks, PTA Acute Rehabilitation Services Pager [phone] Office [phone]     Jerric Oyen Eli Hose 05/25/2018, 11:56 AM

## 2018-05-25 NOTE — Progress Notes (Signed)
PROGRESS NOTE    SALSABEEL GORELICK  AJO:[id number] DOB: Apr 25, 1956 DOA: 05/18/2018 PCP: Patient, No Pcp Per   Brief Narrative:  HPI on 05/18/2018 by Dr. Jennette Kettle Linda Lynch is a 62 y.o. female with medical history significant of schizophrenia.  Patient initially triaged to psychiatric evaluation based on arrival by GPDafter family called and said she was schizophrenic and has been off medications. Reportedly when patient got here she stated she was here for "personal reasons" and pointed to her genital area. On further history taking with mother (who is 65 years old), it seems that patient has been "staggering" with unsteady gait for the past week.  The patient though reports that this has been ongoing for "15-20 years". Patient denies headache.  Denies chest pain.  Does have some SOB. Patient denies any PMH, states mother has HTN.  Patient doesn't seem like the most reliable historian.  Interim history Admitted for speech and balance issues. She was found to have hypertensive emergency with elevated troponins.  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  Neurology consulted.  Also has some psychotic features, psychiatry consulted. Patient calmer today. Pending SNF.  Assessment & Plan   Gait instability/dysarthria -Unclear etiology -CT head: Small hyperdensity right cerebellum, ?  Calcification versus blood.  No evidence of large acute infarct -MRI brain: No acute intracranial abnormality.  Moderately advanced cerebral atrophy with chronic small vessel ischemic disease with multiple remote lacunar infarcts involving bilateral basal ganglia, thalami and pons.  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  No evidence associated acute or recent hemorrhage. -Echocardiogram shows an EF of 70 to 94%, grade 1 diastolic dysfunction -Carotid Doppler  shows bilateral ICA 40 to 59% stenosis.  Bilateral CCA appear diffused heterogeneous soft plaque, left CCA appears intimal thickening.  Bilateral vertebral arteries are patent with antegrade flow -Toxicology screen negative -LDL 136, hemoglobin A1c 5 -UA unremarkable  -Neurology consulted and appreciated, recommended aspirin 1 mg for stroke prevention.  Does not feel that this cavernoma is because of patient's symptoms. -CTA head/neck was ordered (patient refused) -PT and OT recommended SNF -Social work consulted  Hypertensive emergency/elevated troponin -Systolic blood pressures over 200 -Unclear etiology -Patient placed on oral medications however has been refusing to take them.  It does not seem that she is on medications at home. -She reports supposedly having a 10-year history of chest pain which is been unchanged. -EKG reviewed and shows no evidence of ACS -Echocardiogram as above -Continue amlodipine, IV hydralazine, metoprolol  -troponin, peaked at 0.35, down to 0.08  History of CVA/lacunar infarcts -Noted on CT scan as well as MRI -No sequelae from these are evident, likely secondary to uncontrolled hypertension -Continue aspirin 81 mg and control blood pressure -Carotid Doppler as above  Psychiatric history -Patient does appear to have psychotic features at this time- however unknown baseline.  -Discussed with mother via phone: states she is not easy to get along with at times. She states she no longer takes any of her meds and was hospitalized in [place], [place]. She also stated that she feels that her daughter sometimes copies her and can be faking. Discussed with mother the possibility that the patient may have to return home if SNF is not an option, mother voiced understanding. -Psychiatry consulted and appreciated- discussed with Dr. Mariea Clonts, patient appears to be at baseline and would not start medications. Patient will need outpatient  follow up. Does not appear to be a harm to  self or anyone at this time and does not need inpatient psych admission.   Hypokalemia -Resolved with replacement; continue to monitor BMP   Acute kidney injury -Resolved, creatinine currently 0.97 (was 1.3 on admission- peaked to 1.39) -Continue to monitor BMP  DVT Prophylaxis  SCDs  Code Status: Full  Family Communication: None at beside.   Disposition Plan: Admitted. Pending SNF placement/ [facility]  Consultants Neurology Psychiatry   Procedures  Echocardiogram Carotid doppler  Antibiotics   Anti-infectives (From admission, onward)   None      Subjective:   Shareta Fishbaugh seen and examined today. Feels weak and states she does not feel she can get out of bed. Denies current chest pain shortness of breath, abdominal pain.   Objective:   Vitals:   05/24/18 2249 05/25/18 0330 05/25/18 0400 05/25/18 0759  BP: (!) 149/69 (!) 187/74 136/61 (!) 164/71  Pulse: 65 73 65 76  Resp: 18 18 18 18   Temp: 98.2 F (36.8 C) 98.5 F (36.9 C)  98 F (36.7 C)  TempSrc: Oral Oral    SpO2: 94% 98% 98% 96%  Weight:      Height:       No intake or output data in the 24 hours ending 05/25/18 1044 Filed Weights   05/19/18 0600  Weight: 77.3 kg   Exam  General: Well developed, well nourished, NAD, appears stated age  HEENT: NCAT, mucous membranes moist.   Neck: Supple  Cardiovascular: S1 S2 auscultated, RRR, no murmur  Respiratory: Clear to auscultation bilaterally with equal chest rise  Abdomen: Soft, nontender, nondistended, + bowel sounds  Extremities: warm dry without cyanosis clubbing or edema  Neuro: Awake and alert, oriented to self and place, not situation, nonfocal  Psych: Tearful, anxious  Data Reviewed: I have personally reviewed following labs and imaging studies  CBC: Recent Labs  Lab 05/18/18 1716 05/20/18 0528 05/21/18 0605 05/23/18 0417  WBC 10.9* 14.6* 15.8* 12.8*  NEUTROABS 8.8*  --   --   --   HGB 16.6* 14.7 14.5 13.1  HCT 49.3* 42.8 44.7  41.1  MCV 90.1 89.9 91.6 93.2  PLT 358 328 335 884   Basic Metabolic Panel: Recent Labs  Lab 05/20/18 0435 05/21/18 0605 05/22/18 0352 05/23/18 0417 05/24/18 0500  NA 138 138 137 139 139  K 3.2* 3.4* 3.8 3.5 4.0  CL 103 103 104 106 110  CO2 25 23 22 25  21*  GLUCOSE 120* 107* 109* 96 104*  BUN 16 16 19 23 17   CREATININE 1.30* 1.39* 1.30* 1.17* 0.97  CALCIUM 9.0 8.7* 8.8* 8.7* 8.4*  MG 2.2  --   --   --   --    GFR: Estimated Creatinine Clearance: 58.9 mL/min (by C-G formula based on SCr of 0.97 mg/dL). Liver Function Tests: Recent Labs  Lab 05/18/18 1716  AST 22  ALT 18  ALKPHOS 99  BILITOT 0.7  PROT 8.2*  ALBUMIN 4.4   No results for input(s): LIPASE, AMYLASE in the last 168 hours. No results for input(s): AMMONIA in the last 168 hours. Coagulation Profile: Recent Labs  Lab 05/18/18 1716  INR 0.87   Cardiac Enzymes: Recent Labs  Lab 05/21/18 0605  TROPONINI 0.08*   BNP (last 3 results) No results for input(s): PROBNP in the last 8760 hours. HbA1C: No results for input(s): HGBA1C in the last 72 hours. CBG: No results for input(s): GLUCAP in the last 168 hours.  Lipid Profile: No results for input(s): CHOL, HDL, LDLCALC, TRIG, CHOLHDL, LDLDIRECT in the last 72 hours. Thyroid Function Tests: No results for input(s): TSH, T4TOTAL, FREET4, T3FREE, THYROIDAB in the last 72 hours. Anemia Panel: No results for input(s): VITAMINB12, FOLATE, FERRITIN, TIBC, IRON, RETICCTPCT in the last 72 hours. Urine analysis:    Component Value Date/Time   COLORURINE STRAW (A) 05/18/2018 1613   APPEARANCEUR CLEAR 05/18/2018 1613   LABSPEC 1.008 05/18/2018 1613   PHURINE 8.0 05/18/2018 1613   GLUCOSEU NEGATIVE 05/18/2018 1613   HGBUR NEGATIVE 05/18/2018 1613   BILIRUBINUR NEGATIVE 05/18/2018 1613   KETONESUR NEGATIVE 05/18/2018 1613   PROTEINUR >=300 (A) 05/18/2018 1613   NITRITE NEGATIVE 05/18/2018 1613   LEUKOCYTESUR SMALL (A) 05/18/2018 1613   Sepsis  Labs: @LABRCNTIP (procalcitonin:4,lacticidven:4)  ) Recent Results (from the past 240 hour(s))  MRSA PCR Screening     Status: None   Collection Time: 05/19/18  6:07 AM  Result Value Ref Range Status   MRSA by PCR NEGATIVE NEGATIVE Final    Comment:        The GeneXpert MRSA Assay (FDA approved for NASAL specimens only), is one component of a comprehensive MRSA colonization surveillance program. It is not intended to diagnose MRSA infection nor to guide or monitor treatment for MRSA infections. Performed at [facility], [place] [address] [place], [place] [address]       Radiology Studies: No results found.   Scheduled Meds: .  stroke: mapping our early stages of recovery book   Does not apply Once  . amLODipine  10 mg Oral Daily  . aspirin EC  81 mg Oral Daily  . atorvastatin  20 mg Oral q1800  . Influenza vac split quadrivalent PF  0.5 mL Intramuscular Tomorrow-1000  . metoprolol tartrate  25 mg Oral BID  . senna-docusate  1 tablet Oral BID   Continuous Infusions: . sodium chloride       LOS: 7 days   Time Spent in minutes   30 minutes  Sacheen Arrasmith D.O. on 05/25/2018 at 10:44 AM  Between 7am to 7pm - Please see pager noted on [email or web address]  After 7pm go to [email or web address]  And look for the night coverage person covering for me after hours  [facility] Office  [phone]

## 2018-05-26 LAB — BASIC METABOLIC PANEL
ANION GAP: 8 (ref 5–15)
BUN: 15 mg/dL (ref 8–23)
CALCIUM: 8.4 mg/dL — AB (ref 8.9–10.3)
CO2: 21 mmol/L — AB (ref 22–32)
Chloride: 110 mmol/L (ref 98–111)
Creatinine, Ser: 0.94 mg/dL (ref 0.44–1.00)
GFR calc Af Amer: >60 mL/min (ref >60)
GFR calc non Af Amer: >60 mL/min (ref >60)
GLUCOSE: 92 mg/dL (ref 70–99)
Potassium: 3.8 mmol/L (ref 3.5–5.1)
Sodium: 139 mmol/L (ref 135–145)

## 2018-05-26 NOTE — Progress Notes (Signed)
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 PMHx: schizophrenia, HTN    PT Comments    Pt tolerated treatment well. Pt progressing toward goals. Ambulated in hallway. Current plan of care remains appropriate.   Follow Up Recommendations  SNF;Supervision/Assistance - 24 hour     Equipment Recommendations  Rolling walker with 5" wheels    Recommendations for Other Services       Precautions / Restrictions Precautions Precautions: Fall Restrictions Weight Bearing Restrictions: No    Mobility  Bed Mobility               General bed mobility comments: Pt on toilet upon arrival  Transfers Overall transfer level: Needs assistance Equipment used: Rolling walker (2 wheeled) Transfers: Sit to/from Stand Sit to Stand: Supervision         General transfer comment: Pt requested to ambulate to toilet with RN. Pt required supervision for safety and stability  Ambulation/Gait Ambulation/Gait assistance: Min guard Gait Distance (Feet): 80 Feet Assistive device: Rolling walker (2 wheeled) Gait Pattern/deviations: Step-through pattern;Trunk flexed;Decreased stride length Gait velocity: decreased  Gait velocity interpretation: <1.8 ft/sec, indicate of risk for recurrent falls General Gait Details: Verbal cues to increase step cadence which pt was hesistant to do. Verbal cues to keep posture upright.    Stairs             Wheelchair Mobility    Modified Rankin (Stroke Patients Only)       Balance Overall balance assessment: Needs assistance         Standing balance support: Bilateral upper extremity supported Standing balance-Leahy Scale: Poor Standing balance comment: Reliant on Bil UE support of RW                            Cognition  Arousal/Alertness: Awake/alert Behavior During Therapy: Flat affect Overall Cognitive Status: Impaired/Different from baseline Area of Impairment: Safety/judgement;Problem solving;Awareness;Following commands                     Memory: Decreased short-term memory Following Commands: Follows one step commands with increased time Safety/Judgement: Decreased awareness of safety Awareness: Emergent Problem Solving: Slow processing;Decreased initiation;Requires verbal cues;Requires tactile cues General Comments: pt slow to process instructions      Exercises      General Comments General comments (skin integrity, edema, etc.): Pt reports increased fatigue during ambulation      Pertinent Vitals/Pain Pain Assessment: Faces Faces Pain Scale: Hurts little more Pain Location: None specified Pain Descriptors / Indicators: Grimacing Pain Intervention(s): Limited activity within patient's tolerance;Monitored during session;Repositioned    Home Living                      Prior Function            PT Goals (current goals can now be found in the care plan section) Acute Rehab PT Goals Patient Stated Goal: to toilet and ambulate PT Goal Formulation: With patient Time For Goal Achievement: 06/02/18 Potential to Achieve Goals: Fair Progress towards PT goals: Progressing toward goals    Frequency    Min 3X/week      PT Plan Current plan remains appropriate    Co-evaluation  AM-PAC PT "6 Clicks" Daily Activity  Outcome Measure  Difficulty turning over in bed (including adjusting bedclothes, sheets and blankets)?: A Lot Difficulty moving from lying on back to sitting on the side of the bed? : A Lot Difficulty sitting down on and standing up from a chair with arms (e.g., wheelchair, bedside commode, etc,.)?: Unable Help needed moving to and from a bed to chair (including a wheelchair)?: A Little Help needed walking in hospital room?: A  Little Help needed climbing 3-5 steps with a railing? : A Lot 6 Click Score: 13    End of Session Equipment Utilized During Treatment: Gait belt Activity Tolerance: Patient tolerated treatment well;Patient limited by fatigue Patient left: in chair;with call bell/phone within reach;with chair alarm set Nurse Communication: Mobility status PT Visit Diagnosis: Unsteadiness on feet (R26.81);Other abnormalities of gait and mobility (R26.89);Muscle weakness (generalized) (M62.81);Repeated falls (R29.6)     Time: 7989-2119 PT Time Calculation (min) (ACUTE ONLY): 10 min  Charges:  $Gait Training: 8-22 mins                     Selinda Michaels, SPT   Selinda Michaels 05/26/2018, 2:53 PM

## 2018-05-26 NOTE — Progress Notes (Signed)
PROGRESS NOTE    Linda Lynch  HBZ:[id number] DOB: 03-10-1956 DOA: 05/18/2018 PCP: Patient, No Pcp Per   Brief Narrative:  HPI on 05/18/2018 by Dr. Jennette Lynch Linda Lynch is a 62 y.o. female with medical history significant of schizophrenia.  Patient initially triaged to psychiatric evaluation based on arrival by GPDafter family called and said she was schizophrenic and has been off medications. Reportedly when patient got here she stated she was here for "personal reasons" and pointed to her genital area. On further history taking with mother (who is 43 years old), it seems that patient has been "staggering" with unsteady gait for the past week.  The patient though reports that this has been ongoing for "15-20 years". Patient denies headache.  Denies chest pain.  Does have some SOB. Patient denies any PMH, states mother has HTN.  Patient doesn't seem like the most reliable historian.  Interim history Admitted for speech and balance issues. She was found to have hypertensive emergency with elevated troponins.  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  Neurology consulted.  Also has some psychotic features, psychiatry consulted. Patient calmer today. Pending SNF, PASRR.  Assessment & Plan   Gait instability/dysarthria -Unclear etiology -CT head: Small hyperdensity right cerebellum, ?  Calcification versus blood.  No evidence of large acute infarct -MRI brain: No acute intracranial abnormality.  Moderately advanced cerebral atrophy with chronic small vessel ischemic disease with multiple remote lacunar infarcts involving bilateral basal ganglia, thalami and pons.  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  No evidence associated acute or recent hemorrhage. -Echocardiogram shows an EF of 70 to 10%, grade 1 diastolic dysfunction -Carotid  Doppler shows bilateral ICA 40 to 59% stenosis.  Bilateral CCA appear diffused heterogeneous soft plaque, left CCA appears intimal thickening.  Bilateral vertebral arteries are patent with antegrade flow -Toxicology screen negative -LDL 136, hemoglobin A1c 5 -UA unremarkable  -Neurology consulted and appreciated, recommended aspirin 1 mg for stroke prevention.  Does not feel that this cavernoma is because of patient's symptoms. -CTA head/neck was ordered (patient refused) -PT and OT recommended SNF -Social work consulted  Hypertensive emergency/elevated troponin -Systolic blood pressures over 200 -Unclear etiology -Patient placed on oral medications however has been refusing to take them.  It does not seem that she is on medications at home. -She reports supposedly having a 10-year history of chest pain which is been unchanged. -EKG reviewed and shows no evidence of ACS -Echocardiogram as above -Continue amlodipine, IV hydralazine, metoprolol  -troponin, peaked at 0.35, down to 0.08  History of CVA/lacunar infarcts -Noted on CT scan as well as MRI -No sequelae from these are evident, likely secondary to uncontrolled hypertension -Continue aspirin 81 mg and control blood pressure -Carotid Doppler as above  Psychiatric history -Patient does appear to have psychotic features at this time- however unknown baseline.  -Discussed with mother via phone: states she is not easy to get along with at times. She states she no longer takes any of her meds and was [place] She also stated that she feels that her daughter sometimes copies her and can be faking. Discussed with mother the possibility that the patient may have to return home if SNF is not an option, mother voiced understanding. -Psychiatry consulted and appreciated- discussed with Dr. Mariea Lynch, patient appears to be at baseline and would not start medications. Patient will need  outpatient follow up. Does not appear to be a  harm to self or anyone at this time and does not need inpatient psych admission.   Hypokalemia -Resolved with replacement; continue to monitor BMP   Acute kidney injury -Resolved, creatinine currently 0.94 (was 1.3 on admission- peaked to 1.39) -Continue to monitor BMP  DVT Prophylaxis  SCDs  Code Status: Full  Family Communication: None at beside.   Disposition Plan: Admitted. Pending SNF placement/ [facility]  Consultants Neurology Psychiatry   Procedures  Echocardiogram Carotid doppler  Antibiotics   Anti-infectives (From admission, onward)   None      Subjective:   Linda Lynch seen and examined today.  Denies current chest pain, shortness breath, abdominal pain, nausea vomiting, diarrhea constipation, dizziness or headache.  Continues to feel weak.  Objective:   Vitals:   05/26/18 0354 05/26/18 0956 05/26/18 1222 05/26/18 1528  BP: (!) 174/71 (!) 178/78 (!) 166/68 (!) 177/63  Pulse: 67 71 (!) 56 66  Resp: 20 16 17 15   Temp: 98.1 F (36.7 C) 98.2 F (36.8 C) 98.3 F (36.8 C) 98.3 F (36.8 C)  TempSrc: Oral Oral Oral Oral  SpO2: 97% 97% 99% 99%  Weight:      Height:        Intake/Output Summary (Last 24 hours) at 05/26/2018 1539 Last data filed at 05/26/2018 1443 Gross per 24 hour  Intake 720 ml  Output -  Net 720 ml   Filed Weights   05/19/18 0600  Weight: 77.3 kg   Exam  General: Well developed, well nourished, NAD, appears stated age  HEENT: NCAT, mucous membranes moist.   Neck: Supple  Cardiovascular: S1 S2 auscultated, no rubs, murmurs or gallops. Regular rate and rhythm.  Respiratory: Clear to auscultation bilaterally with equal chest rise  Abdomen: Soft, nontender, nondistended, + bowel sounds  Extremities: warm dry without cyanosis clubbing or edema  Neuro: Awake and alert, oriented to self and place, nonfocal  Psych: appropriate mood and affect  Data Reviewed: I have personally reviewed following labs and imaging  studies  CBC: Recent Labs  Lab 05/20/18 0528 05/21/18 0605 05/23/18 0417  WBC 14.6* 15.8* 12.8*  HGB 14.7 14.5 13.1  HCT 42.8 44.7 41.1  MCV 89.9 91.6 93.2  PLT 328 335 154   Basic Metabolic Panel: Recent Labs  Lab 05/20/18 0435 05/21/18 0605 05/22/18 0352 05/23/18 0417 05/24/18 0500 05/26/18 0421  NA 138 138 137 139 139 139  K 3.2* 3.4* 3.8 3.5 4.0 3.8  CL 103 103 104 106 110 110  CO2 25 23 22 25  21* 21*  GLUCOSE 120* 107* 109* 96 104* 92  BUN 16 16 19 23 17 15   CREATININE 1.30* 1.39* 1.30* 1.17* 0.97 0.94  CALCIUM 9.0 8.7* 8.8* 8.7* 8.4* 8.4*  MG 2.2  --   --   --   --   --    GFR: Estimated Creatinine Clearance: 60.8 mL/min (by C-G formula based on SCr of 0.94 mg/dL). Liver Function Tests: No results for input(s): AST, ALT, ALKPHOS, BILITOT, PROT, ALBUMIN in the last 168 hours. No results for input(s): LIPASE, AMYLASE in the last 168 hours. No results for input(s): AMMONIA in the last 168 hours. Coagulation Profile: No results for input(s): INR, PROTIME in the last 168 hours. Cardiac Enzymes: Recent Labs  Lab 05/21/18 0605  TROPONINI 0.08*   BNP (last 3 results) No results for input(s): PROBNP in the last 8760 hours. HbA1C: No results for input(s): HGBA1C in the last 72  hours. CBG: No results for input(s): GLUCAP in the last 168 hours. Lipid Profile: No results for input(s): CHOL, HDL, LDLCALC, TRIG, CHOLHDL, LDLDIRECT in the last 72 hours. Thyroid Function Tests: No results for input(s): TSH, T4TOTAL, FREET4, T3FREE, THYROIDAB in the last 72 hours. Anemia Panel: No results for input(s): VITAMINB12, FOLATE, FERRITIN, TIBC, IRON, RETICCTPCT in the last 72 hours. Urine analysis:    Component Value Date/Time   COLORURINE STRAW (A) 05/18/2018 1613   APPEARANCEUR CLEAR 05/18/2018 1613   LABSPEC 1.008 05/18/2018 1613   PHURINE 8.0 05/18/2018 1613   GLUCOSEU NEGATIVE 05/18/2018 1613   HGBUR NEGATIVE 05/18/2018 1613   BILIRUBINUR NEGATIVE 05/18/2018 1613    KETONESUR NEGATIVE 05/18/2018 1613   PROTEINUR >=300 (A) 05/18/2018 1613   NITRITE NEGATIVE 05/18/2018 1613   LEUKOCYTESUR SMALL (A) 05/18/2018 1613   Sepsis Labs: @LABRCNTIP (procalcitonin:4,lacticidven:4)  ) Recent Results (from the past 240 hour(s))  MRSA PCR Screening     Status: None   Collection Time: 05/19/18  6:07 AM  Result Value Ref Range Status   MRSA by PCR NEGATIVE NEGATIVE Final    Comment:        The GeneXpert MRSA Assay (FDA approved for NASAL specimens only), is one component of a comprehensive MRSA colonization surveillance program. It is not intended to diagnose MRSA infection nor to guide or monitor treatment for MRSA infections. Performed at [facility], [place] [address], [place], [place] [address]       Radiology Studies: No results found.   Scheduled Meds: . amLODipine  10 mg Oral Daily  . aspirin EC  81 mg Oral Daily  . atorvastatin  20 mg Oral q1800  . Influenza vac split quadrivalent PF  0.5 mL Intramuscular Tomorrow-1000  . metoprolol tartrate  25 mg Oral BID  . senna-docusate  1 tablet Oral BID   Continuous Infusions: . sodium chloride       LOS: 8 days   Time Spent in minutes   20 minutes  Jaken Fregia D.O. on 05/26/2018 at 3:39 PM  Between 7am to 7pm - Please see pager noted on [email or web address]  After 7pm go to [email or web address]  And look for the night coverage person covering for me after hours  [facility] Office  [phone]

## 2018-05-27 DIAGNOSIS — I160 Hypertensive urgency: Secondary | ICD-10-CM

## 2018-05-27 DIAGNOSIS — R270 Ataxia, unspecified: Secondary | ICD-10-CM

## 2018-05-27 MED ORDER — ASPIRIN 81 MG PO TBEC: 81.0000 mg | DELAYED_RELEASE_TABLET | Freq: Every day | ORAL | 0 refills | Status: DC

## 2018-05-27 MED ORDER — ATORVASTATIN CALCIUM 20 MG PO TABS: 20.0000 mg | ORAL_TABLET | Freq: Every day | ORAL | 0 refills | Status: DC

## 2018-05-27 MED ORDER — METOPROLOL TARTRATE 25 MG PO TABS: 25.0000 mg | ORAL_TABLET | Freq: Two times a day (BID) | ORAL | 0 refills | Status: DC

## 2018-05-27 MED ORDER — AMLODIPINE BESYLATE 10 MG PO TABS: 10.0000 mg | ORAL_TABLET | Freq: Every day | ORAL | 0 refills | Status: DC

## 2018-05-27 MED ORDER — SENNOSIDES-DOCUSATE SODIUM 8.6-50 MG PO TABS: 1.0000 | ORAL_TABLET | Freq: Two times a day (BID) | ORAL | 0 refills | Status: DC

## 2018-05-27 NOTE — Clinical Social Work Placement (Signed)
Nurse to call report to [phone], Room 101B      CLINICAL SOCIAL WORK PLACEMENT  NOTE  Date:  05/27/2018  Patient Details  Name: Linda Lynch MRN: [id number] Date of Birth: [birth year]  Clinical Social Work is seeking post-discharge placement for this patient at the [place] level of care (*CSW will initial, date and re-position this form in  chart as items are completed):  Yes   Patient/family provided with [place] Work Department's list of facilities offering this level of care within the geographic area requested by the patient (or if unable, by the patient's family).  Yes   Patient/family informed of their freedom to choose among providers that offer the needed level of care, that participate in Medicare, Medicaid or managed care program needed by the patient, have an available bed and are willing to accept the patient.  Yes   Patient/family informed of [facility]'s ownership interest in [facility], as well as of the fact that they are under no obligation to receive care at these facilities.  PASRR submitted to EDS on       PASRR number received on 05/27/18     Existing PASRR number confirmed on       FL2 transmitted to all facilities in geographic area requested by pt/family on 05/21/18     FL2 transmitted to all facilities within larger geographic area on       Patient informed that his/her managed care company has contracts with or will negotiate with certain facilities, including the following:        Yes   Patient/family informed of bed offers received.  Patient chooses bed at [facility]     Physician recommends and patient chooses bed at      Patient to be transferred to [facility] on 05/27/18.  Patient to be transferred to facility by [facility]     Patient family notified on 05/27/18 of transfer.  Name of family member notified:  Mom, [facility]     PHYSICIAN       Additional  Comment:    _______________________________________________ Geralynn Ochs, [place] 05/27/2018, 12:08 PM

## 2018-05-27 NOTE — NC FL2 (Signed)
[place] LEVEL OF CARE SCREENING TOOL     IDENTIFICATION  Patient Name: Linda Lynch Birthdate: 22-Mar-1956 Sex: female Admission Date (Current Location): 05/18/2018  [facility] and [place] Number:  Herbalist and Address:  [facility], [place] [address], [place], [place] [address]      Provider Number: [id number]  Attending Physician Name and Address:  Aline August, MD  Relative Name and Phone Number:       Current Level of Care: Hospital Recommended Level of Care: [place] Prior Approval Number:    Date Approved/Denied:   PASRR Number: [id number] F; Expires 07/25/18  Discharge Plan: SNF    Current Diagnoses: Patient Active Problem List   Diagnosis Date Noted  . Evaluation by psychiatric service required   . Ataxia   . ICH (intracerebral hemorrhage) ([place]) 05/18/2018  . Hypertensive urgency 05/18/2018    Orientation RESPIRATION BLADDER Height & Weight     Self, Time, Place  Normal Continent Weight: 170 lb 6.7 oz (77.3 kg) Height:  5\' 4"  (162.6 cm)  BEHAVIORAL SYMPTOMS/MOOD NEUROLOGICAL BOWEL NUTRITION STATUS      Continent Diet(regular)  AMBULATORY STATUS COMMUNICATION OF NEEDS Skin   Limited Assist Verbally Normal                       Personal Care Assistance Level of Assistance  Bathing, Feeding, Dressing Bathing Assistance: Limited assistance Feeding assistance: Independent Dressing Assistance: Limited assistance     Functional Limitations Info  Sight, Hearing, Speech Sight Info: Impaired(wears glasses) Hearing Info: Adequate Speech Info: Adequate    SPECIAL CARE FACTORS FREQUENCY  PT (By licensed PT), OT (By licensed OT)     PT Frequency: 5x/wk OT Frequency: 5x/wk            Contractures Contractures Info: Not present    Additional Factors Info  Code Status, Allergies Code Status Info: Full Allergies Info: NKA           Current Medications (05/27/2018):  This is  the current hospital active medication list Current Facility-Administered Medications  Medication Dose Route Frequency Provider Last Rate Last Dose  . 0.9 %  sodium chloride infusion   Intravenous Continuous Cristal Ford, DO      . acetaminophen (TYLENOL) tablet 650 mg  650 mg Oral Q4H PRN Cristal Ford, DO   650 mg at 05/26/18 2135   Or  . acetaminophen (TYLENOL) solution 650 mg  650 mg Per Tube Q4H PRN Cristal Ford, DO       Or  . acetaminophen (TYLENOL) suppository 650 mg  650 mg Rectal Q4H PRN Cristal Ford, DO      . amLODipine (NORVASC) tablet 10 mg  10 mg Oral Daily Cristal Ford, DO   10 mg at 05/27/18 1042  . aspirin EC tablet 81 mg  81 mg Oral Daily Cristal Ford, DO   81 mg at 05/26/18 1110  . atorvastatin (LIPITOR) tablet 20 mg  20 mg Oral q1800 Cristal Ford, DO   20 mg at 05/26/18 1705  . hydrALAZINE (APRESOLINE) injection 10-20 mg  10-20 mg Intravenous Q4H PRN Cristal Ford, DO   10 mg at 05/26/18 1536  . Influenza vac split quadrivalent PF (FLUARIX) injection 0.5 mL  0.5 mL Intramuscular Tomorrow-1000 Mikhail, [place], DO      . metoprolol tartrate (LOPRESSOR) tablet 25 mg  25 mg Oral BID Cristal Ford, DO   25 mg at 05/27/18 1042  . ondansetron (ZOFRAN)  injection 4 mg  4 mg Intravenous Q6H PRN Cristal Ford, DO   4 mg at 05/19/18 8638  . senna-docusate (Senokot-S) tablet 1 tablet  1 tablet Oral BID Cristal Ford, DO   1 tablet at 05/27/18 1042     Discharge Medications: Please see discharge summary for a list of discharge medications.  Relevant Imaging Results:  Relevant Lab Results:   Additional Information SS#: [id number]  Geralynn Ochs, LCSW

## 2018-05-27 NOTE — Care Management Important Message (Signed)
Important Message  Patient Details  Name: Linda Lynch MRN: [id number] Date of Birth: 1956-01-10   Medicare Important Message Given:  Yes    Orbie Pyo 05/27/2018, 2:18 PM

## 2018-05-27 NOTE — Discharge Summary (Signed)
Physician Discharge Summary  Linda Lynch HCW:[id number] DOB: 12/02/1955 DOA: 05/18/2018  PCP: Patient, No Pcp Per  Admit date: 05/18/2018 Discharge date: 05/27/2018  Admitted From: Home Disposition: SNF  Recommendations for Outpatient Follow-up:  1. Follow up with SNF provider at earliest convenience 2. Follow-up with psychiatry and neurology as an outpatient   Home Health: No Equipment/Devices: None  Discharge Condition: Stable CODE STATUS: Full Diet recommendation: Heart Healthy    Brief/Interim Summary: 62 year old female with history of schizophrenia was admitted for speech and balance issues.  She was found to have hypertensive emergency and elevated troponins.  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  Neurology and psychiatry were consulted.  PT recommended SNF.  Patient will be discharged to SNF once bed is available.  Discharge Diagnoses:  Principal Problem:   Evaluation by psychiatric service required Active Problems:   ICH (intracerebral hemorrhage) ([place])   Hypertensive urgency   Ataxia  Gait instability/dysarthria -Unclear etiology -CT head: Small hyperdensity right cerebellum,?  Calcification versus blood.  No evidence of large acute infarct --MRI brain: No acute intracranial abnormality.  Moderately advanced cerebral atrophy with chronic small vessel ischemic disease with multiple remote lacunar infarcts involving bilateral basal ganglia, thalami and pons.  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  No evidence associated acute or recent hemorrhage. -Echocardiogram shows an EF of 70 to 17%, grade 1 diastolic dysfunction -Carotid Doppler shows bilateral ICA 40 to 59% stenosis.  Bilateral CCA appear diffused heterogeneous soft plaque, left CCA appears intimal thickening.  Bilateral vertebral arteries are patent  with antegrade flow -Toxicology screen negative -LDL 136, hemoglobin A1c 5 -UA unremarkable  -Neurology consulted and appreciated, recommended aspirin 81 mg for stroke prevention.  Does not feel that this cavernoma is because of patient's symptoms. -CTA head/neck was ordered (patient refused) -PT and OT recommended SNF -Discharge to SNF once bed is available -Outpatient follow-up with neurology  Hypertensive emergency/elevated troponin -Systolic blood pressures over 200 on presentation -Patient placed on oral medications however has been refusing to take them.  It does not seem that she is on medications at home. -Echocardiogram as above -troponin, peaked at 0.35, down to 0.08 -Blood pressures much improved.  Continue metoprolol, amlodipine on discharge.  Outpatient follow-up  History of CVA/lacunar infarcts -Noted on CT scan as well as MRI -No sequelae from these are evident, likely secondary to uncontrolled hypertension -Continue aspirin 81 mg and control blood pressure -Carotid Doppler as above  Psychiatric history -Patient does appear to have psychotic features at this time- however unknown baseline.  -Psychiatric evaluation appreciated-  patient appears to be at baseline and would not start medications. Patient will need outpatient follow up. Does not appear to be a harm to self or anyone at this time and does not need inpatient psych admission.   Hypokalemia -Resolved with replacement   Acute kidney injury -Resolved.  Outpatient follow-up  Discharge Instructions  Discharge Instructions    Ambulatory referral to Neurology   Complete by:  As directed    An appointment is requested in approximately: 2 weeks. followup for unsteady gait   Ambulatory referral to Psychiatry   Complete by:  As directed    Call MD for:  difficulty breathing, headache or visual disturbances   Complete by:  As directed    Call MD for:  extreme fatigue   Complete by:  As directed  Call MD  for:  hives   Complete by:  As directed    Call MD for:  persistant dizziness or light-headedness   Complete by:  As directed    Call MD for:  persistant nausea and vomiting   Complete by:  As directed    Call MD for:  severe uncontrolled pain   Complete by:  As directed    Call MD for:  temperature >100.4   Complete by:  As directed    Diet - low sodium heart healthy   Complete by:  As directed    Increase activity slowly   Complete by:  As directed      Allergies as of 05/27/2018   No Known Allergies     Medication List    TAKE these medications   amLODipine 10 MG tablet Commonly known as:  NORVASC Take 1 tablet (10 mg total) by mouth daily.   aspirin 81 MG EC tablet Take 1 tablet (81 mg total) by mouth daily.   atorvastatin 20 MG tablet Commonly known as:  LIPITOR Take 1 tablet (20 mg total) by mouth daily at 6 PM.   metoprolol tartrate 25 MG tablet Commonly known as:  LOPRESSOR Take 1 tablet (25 mg total) by mouth 2 (two) times daily.   senna-docusate 8.6-50 MG tablet Commonly known as:  Senokot-S Take 1 tablet by mouth 2 (two) times daily.      Follow-up Information    PCP. Schedule an appointment as soon as possible for a visit in 1 week(s).        Psychiatry. Schedule an appointment as soon as possible for a visit in 1 week(s).          No Known Allergies  Consultations:  Neurology/psychiatry   Procedures/Studies: Dg Chest 1 View  Result Date: 05/18/2018 CLINICAL DATA:  Nodular density at the left lung base question nipple shadow. EXAM: CHEST  1 VIEW COMPARISON:  05/18/2018 chest radiograph at 1755 hours FINDINGS: Nipple markers have been placed and repeat imaging of the chest performed. Heart and mediastinal contours are within normal limits. No pulmonary consolidation, effusion or pneumothorax. No pulmonary edema. The nodular density seen previously at the left lung base is not visualized on current exam CT either having represented the left  nipple shadow which now projects below the left hemidiaphragm or may have represented a summation of overlapping ribs and vessels. No discrete dominant mass is seen. No pulmonary consolidation or effusion. IMPRESSION: Nonvisualized nodular opacity previously seen at the left lung base laterally. Findings may have represented the left nipple shadow which now projects just below the left hemidiaphragm on current study or possibly a summation of overlapping ribs and vessels previously. Unfortunately, to be absolutely certain that there is no pulmonary nodule being obscured by the diaphragm, CT of the chest without IV contrast would be the study of choice. Electronically Signed   By: Ashley Royalty M.D.   On: 05/18/2018 20:46   Dg Chest 2 View  Result Date: 05/18/2018 CLINICAL DATA:  Elevated troponin.  Shortness of breath. EXAM: CHEST - 2 VIEW COMPARISON:  None. FINDINGS: 1.2 cm nodular structure in the left lower chest is indeterminate. Otherwise, the lungs are clear. Heart and mediastinum are within normal limits. Trachea is midline. Negative for a pneumothorax. No large pleural effusions. No acute bone abnormality. IMPRESSION: No active cardiopulmonary disease. Indeterminate 1.2 cm nodular density in left lower chest. This could represent a nipple shadow. Recommend a follow-up chest radiograph with nipple markers.  Electronically Signed   By: Markus Daft M.D.   On: 05/18/2018 18:07   Ct Head Wo Contrast  Result Date: 05/18/2018 CLINICAL DATA:  62 year old female with schizophrenia off medication. Initial encounter. EXAM: CT HEAD WITHOUT CONTRAST TECHNIQUE: Contiguous axial images were obtained from the base of the skull through the vertex without intravenous contrast. COMPARISON:  None. FINDINGS: Brain: Small hyperdensity right cerebellum I suspect represents calcification rather than blood and may be related to remote insult such as remote small infarct. Cannot exclude underlying small cavernoma. Bilateral  basal ganglia, bilateral thalamic, left paracentral pontine and left corona radiata infarcts. I suspect these are remote infarcts without CT evidence of large acute infarct. Prominent chronic microvascular changes Mild global atrophy. No intracranial mass lesion seen separate from above described findings. Vascular: No hyperdense vessel. Skull: Hyperostosis frontalis interna incidentally noted. Sinuses/Orbits: No acute orbital abnormality. Visualized paranasal sinuses are clear. Other: Mastoid air cells and middle ear cavities are clear. IMPRESSION: 1. Small hyperdensity right cerebellum I suspect represents calcification rather than blood and may be related to remote insult such as remote small infarct. Cannot exclude underlying small cavernoma. If there is any change in the patient's mental status, this can be re-assessed on follow-up to exclude the less likely consideration of intracranial hemorrhage. 2. 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 I suspect these are remote infarcts without CT evidence of large acute infarct. 3. Prominent chronic microvascular changes 4. Mild global atrophy. Electronically Signed   By: Genia Del M.D.   On: 05/18/2018 16:57   Mr Brain Wo Contrast  Result Date: 05/18/2018 CLINICAL DATA:  Initial evaluation for acute altered mental status, ataxia, slurred speech, headache. EXAM: MRI HEAD WITHOUT CONTRAST TECHNIQUE: Multiplanar, multiecho pulse sequences of the brain and surrounding structures were obtained without intravenous contrast. COMPARISON:  Prior CT from earlier the same day. FINDINGS: Brain: Diffuse prominence of the CSF containing spaces compatible with generalized cerebral atrophy, advanced for age. Confluent T2/FLAIR hyperintensity within the periventricular and deep white matter both cerebral hemispheres, fairly advanced in nature, and most likely related to chronic microvascular ischemic change.  Multiple remote lacunar infarcts present within the bilateral basal ganglia/corona radiata as well as the thalami and pons. Chronic hemosiderin staining noted about a right basal ganglia infarct. Small remote left cerebellar infarct noted. Few additional scattered chronic micro hemorrhages noted involving the supratentorial brain, likely related to hypertension and/or small vessel disease. 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 No associated edema to suggest acute hemorrhage. No abnormal foci of restricted diffusion to suggest acute or subacute ischemia. Gray-white matter differentiation maintained. No acute intracranial hemorrhage. No other mass lesion. No midline shift or mass effect. No hydrocephalus. No extra-axial fluid collection. Pituitary gland within normal limits. Vascular: Major intracranial vascular flow voids are maintained. Skull and upper cervical spine: Craniocervical junction normal. Upper cervical spine within normal limits. Bone marrow signal intensity within normal limits. Scalp soft tissues unremarkable. Sinuses/Orbits: Globes and orbital soft tissues within normal limits. Mild mucosal thickening within the ethmoidal air cells. Paranasal sinuses are otherwise clear. No mastoid effusion. Other: None. IMPRESSION: 1. No acute intracranial abnormality. 2. Moderately advanced cerebral atrophy with chronic small vessel ischemic disease, with multiple remote lacunar infarcts involving the bilateral basal ganglia, thalami, and pons. 3. 13 mm lesion with T2 hypointense rim involving the right cerebellar hemisphere, corresponding with  previously noted hyperdensity. 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 No evidence associated acute or recent hemorrhage. 4. Few additional scattered chronic micro hemorrhages scattered throughout the brain, favored to be related to  poorly controlled hypertension. Electronically Signed   By: Jeannine Boga M.D.   On: 05/18/2018 21:39     Echocardiogram shows an EF of 70 to 59%, grade 1 diastolic dysfunction -Carotid Doppler shows bilateral ICA 40 to 59% stenosis.  Bilateral CCA appear diffused heterogeneous soft plaque, left CCA appears intimal thickening.  Bilateral vertebral arteries are patent with antegrade flow    Subjective: Patient seen and examined at bedside.  He denies any overnight fever, nausea, vomiting.  Discharge Exam: Vitals:   05/27/18 0340 05/27/18 0806  BP: (!) 147/51 (!) 161/70  Pulse: (!) 58 61  Resp: 20 20  Temp: 98.2 F (36.8 C) 98.4 F (36.9 C)  SpO2: 98% 98%   Vitals:   05/26/18 1947 05/26/18 2328 05/27/18 0340 05/27/18 0806  BP: (!) 158/78 (!) 165/72 (!) 147/51 (!) 161/70  Pulse: 67 (!) 56 (!) 58 61  Resp: 18 18 20 20   Temp: 98 F (36.7 C) 97.9 F (36.6 C) 98.2 F (36.8 C) 98.4 F (36.9 C)  TempSrc: Oral Oral Oral Oral  SpO2: 98% 99% 98% 98%  Weight:      Height:        General: Pt is alert, awake, not in acute distress Cardiovascular: rate controlled, S1/S2 + Respiratory: bilateral decreased breath sounds at bases Abdominal: Soft, NT, ND, bowel sounds + Extremities: no edema, no cyanosis    The results of significant diagnostics from this hospitalization (including imaging, microbiology, ancillary and laboratory) are listed below for reference.     Microbiology: Recent Results (from the past 240 hour(s))  MRSA PCR Screening     Status: None   Collection Time: 05/19/18  6:07 AM  Result Value Ref Range Status   MRSA by PCR NEGATIVE NEGATIVE Final    Comment:        The GeneXpert MRSA Assay (FDA approved for NASAL specimens only), is one component of a comprehensive MRSA colonization surveillance program. It is not intended to diagnose MRSA infection nor to guide or monitor treatment for MRSA infections. Performed at [facility], [place]  [address], [place], [place] [address]      Labs: BNP (last 3 results) No results for input(s): BNP in the last 8760 hours. Basic Metabolic Panel: Recent Labs  Lab 05/21/18 0605 05/22/18 0352 05/23/18 0417 05/24/18 0500 05/26/18 0421  NA 138 137 139 139 139  K 3.4* 3.8 3.5 4.0 3.8  CL 103 104 106 110 110  CO2 23 22 25  21* 21*  GLUCOSE 107* 109* 96 104* 92  BUN 16 19 23 17 15   CREATININE 1.39* 1.30* 1.17* 0.97 0.94  CALCIUM 8.7* 8.8* 8.7* 8.4* 8.4*   Liver Function Tests: No results for input(s): AST, ALT, ALKPHOS, BILITOT, PROT, ALBUMIN in the last 168 hours. No results for input(s): LIPASE, AMYLASE in the last 168 hours. No results for input(s): AMMONIA in the last 168 hours. CBC: Recent Labs  Lab 05/21/18 0605 05/23/18 0417  WBC 15.8* 12.8*  HGB 14.5 13.1  HCT 44.7 41.1  MCV 91.6 93.2  PLT 335 302   Cardiac Enzymes: Recent Labs  Lab 05/21/18 0605  TROPONINI 0.08*   BNP: Invalid input(s): POCBNP CBG: No results for input(s): GLUCAP in the last 168 hours. D-Dimer No results for input(s): DDIMER in the last  72 hours. Hgb A1c No results for input(s): HGBA1C in the last 72 hours. Lipid Profile No results for input(s): CHOL, HDL, LDLCALC, TRIG, CHOLHDL, LDLDIRECT in the last 72 hours. Thyroid function studies No results for input(s): TSH, T4TOTAL, T3FREE, THYROIDAB in the last 72 hours.  Invalid input(s): FREET3 Anemia work up No results for input(s): VITAMINB12, FOLATE, FERRITIN, TIBC, IRON, RETICCTPCT in the last 72 hours. Urinalysis    Component Value Date/Time   COLORURINE STRAW (A) 05/18/2018 1613   APPEARANCEUR CLEAR 05/18/2018 1613   LABSPEC 1.008 05/18/2018 1613   PHURINE 8.0 05/18/2018 1613   GLUCOSEU NEGATIVE 05/18/2018 1613   HGBUR NEGATIVE 05/18/2018 1613   BILIRUBINUR NEGATIVE 05/18/2018 1613   KETONESUR NEGATIVE 05/18/2018 1613   PROTEINUR >=300 (A) 05/18/2018 1613   NITRITE NEGATIVE 05/18/2018 1613   LEUKOCYTESUR SMALL (A) 05/18/2018 1613    Sepsis Labs Invalid input(s): PROCALCITONIN,  WBC,  LACTICIDVEN Microbiology Recent Results (from the past 240 hour(s))  MRSA PCR Screening     Status: None   Collection Time: 05/19/18  6:07 AM  Result Value Ref Range Status   MRSA by PCR NEGATIVE NEGATIVE Final    Comment:        The GeneXpert MRSA Assay (FDA approved for NASAL specimens only), is one component of a comprehensive MRSA colonization surveillance program. It is not intended to diagnose MRSA infection nor to guide or monitor treatment for MRSA infections. Performed at [facility], [place] [address], [place], [place] [address]      Time coordinating discharge: 35 minutes  SIGNED:   Aline August, MD  [facility] 05/27/2018, 10:38 AM Pager: [phone]  If 7PM-7AM, please contact night-coverage [email or web address] Password [email or web address]

## 2019-10-20 IMAGING — MR MR HEAD W/O CM
9 of 10 series · 41 of 48 positions shown · non-contrast
Comparison: Prior CT from earlier the same day.

CLINICAL DATA: Initial evaluation for acute altered mental status,
ataxia, slurred speech, headache.

EXAM:
MRI HEAD WITHOUT CONTRAST
TECHNIQUE: Multiplanar, multiecho pulse sequences of the brain and surrounding
structures were obtained without intravenous contrast.

[Series 3: T1 · sagittal · 5.0mm · 0.47mm/px · 3 of 23 slices shown]
[im 1/23]
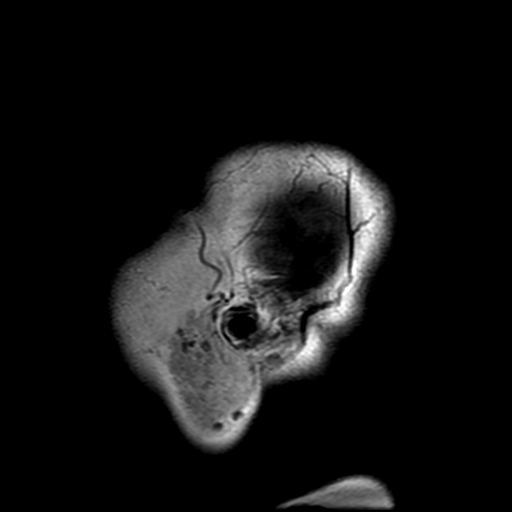
[im 12/23]
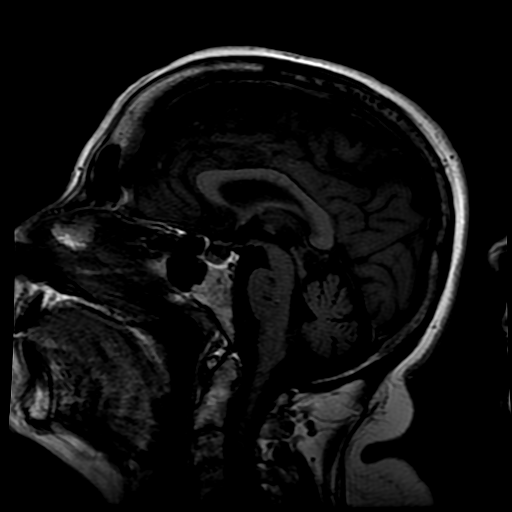
[im 23/23]
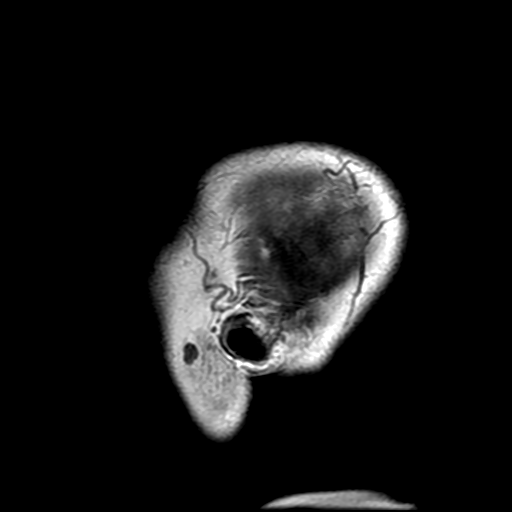

[Series 4: T2 · axial · 5.0mm · 0.86mm/px · z∈[-29,+112]mm · 2 of 22 slices shown (1 of 2)]
[im 1/22]
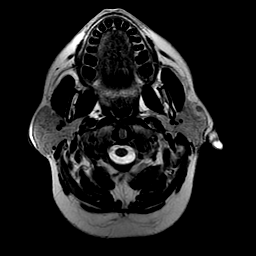
[im 22/22]
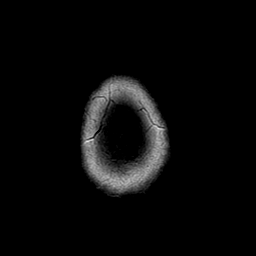

[Series 5: DWI · axial · 3.0mm · 1.09mm/px · z∈[-18,+119]mm · 11 of 98 slices shown (1 of 4)]
[im 1/98]
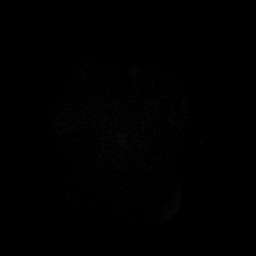
[im 10/98]
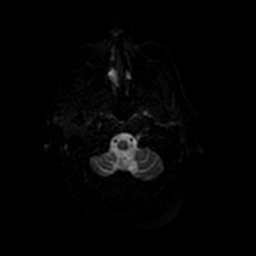
[im 20/98]
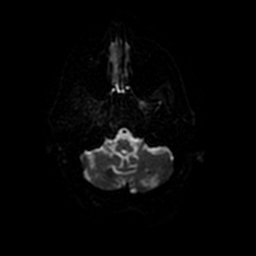
[im 30/98]
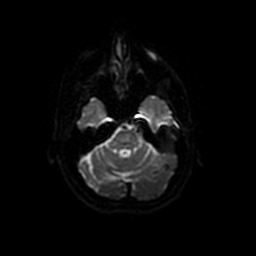
[im 39/98]
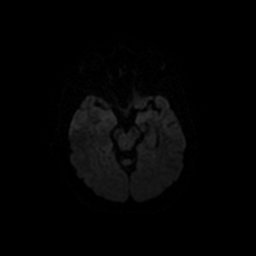
[im 49/98]
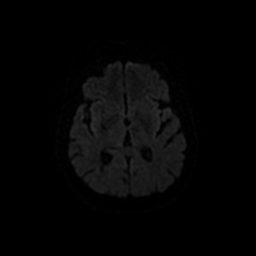
[im 59/98]
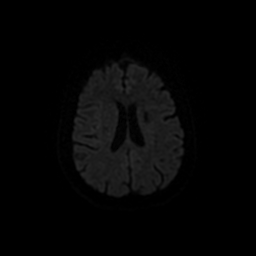
[im 68/98]
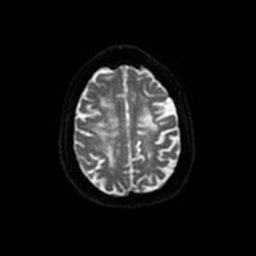
[im 78/98]
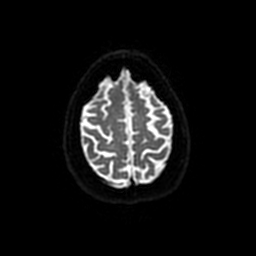
[im 88/98]
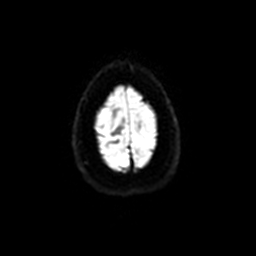
[im 98/98]
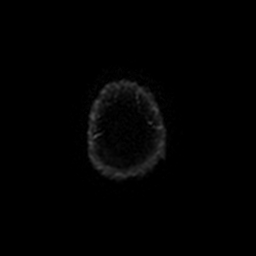

[Series 6: FLAIR · axial · 3.0mm · 0.43mm/px · z∈[-27,+110]mm · 3 of 25 slices shown]
[im 1/25]
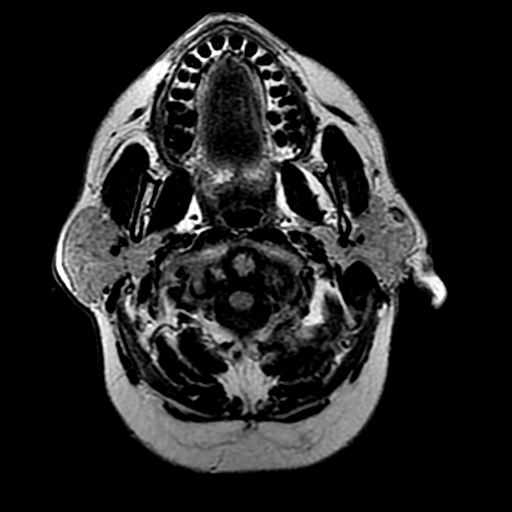
[im 13/25]
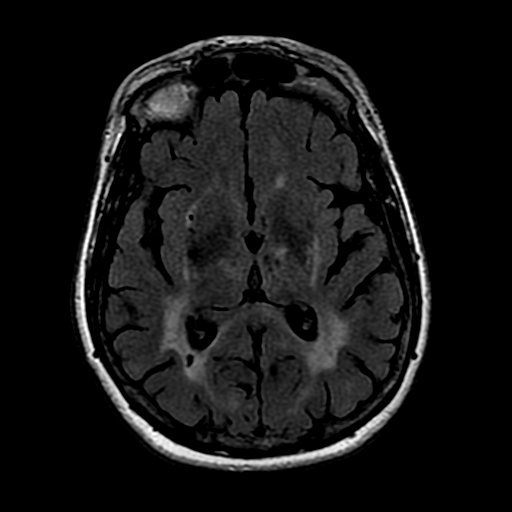
[im 25/25]
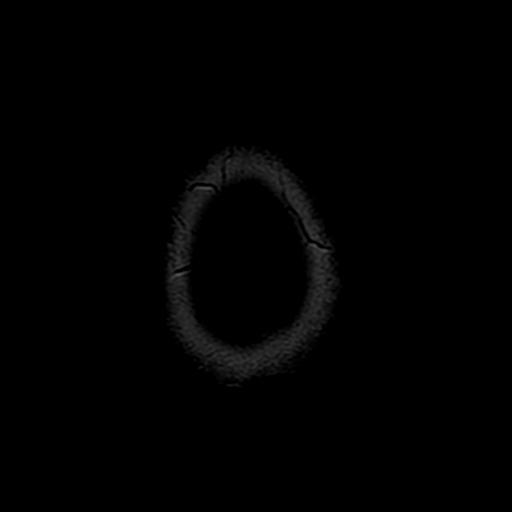

[Series 7: ax mpgr · axial · 5.0mm · 0.43mm/px · 1 of 22 slices shown]
[im 1/22]
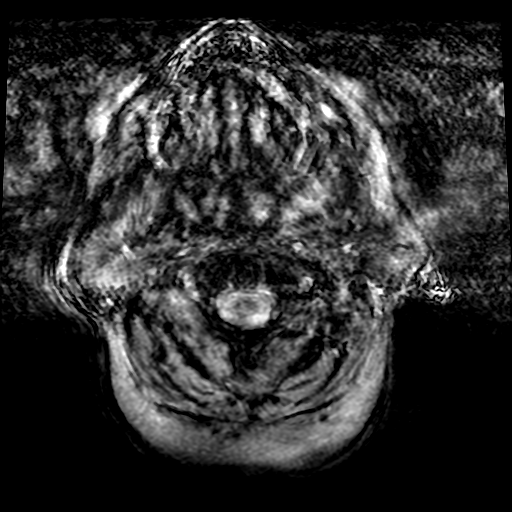

[Series 8: DWI · coronal · 4.0mm · 1.09mm/px · 9 of 82 slices shown (2 of 4)]
[im 1/82]
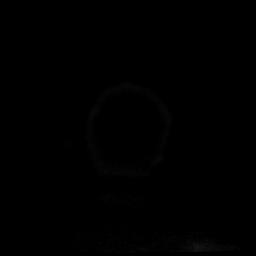
[im 11/82]
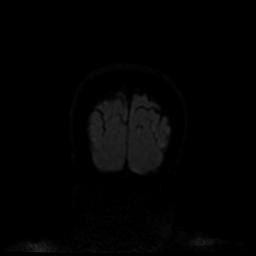
[im 21/82]
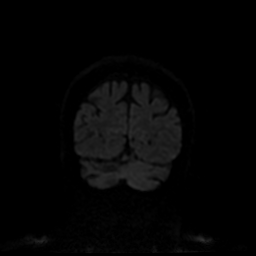
[im 31/82]
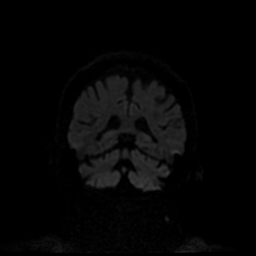
[im 41/82]
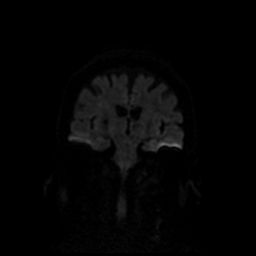
[im 51/82]
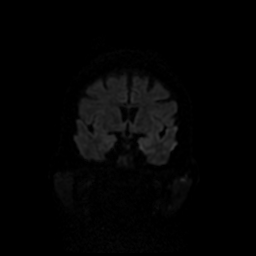
[im 61/82]
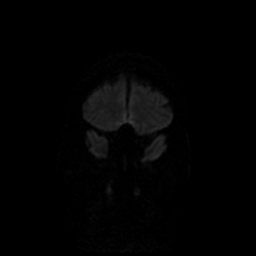
[im 71/82]
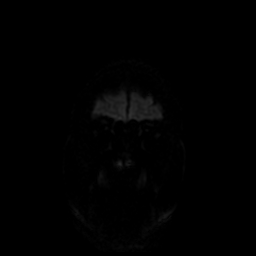
[im 82/82]
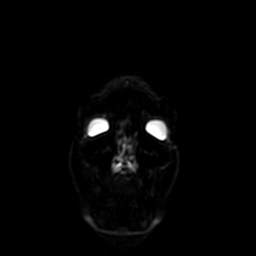

[Series 10: T2 · coronal · 5.0mm · 0.90mm/px · 3 of 26 slices shown (2 of 2)]
[im 1/26]
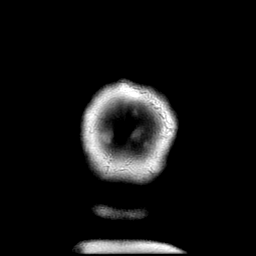
[im 13/26]
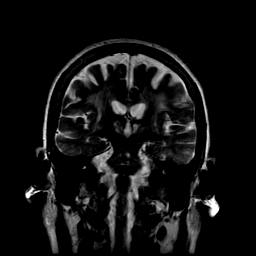
[im 26/26]
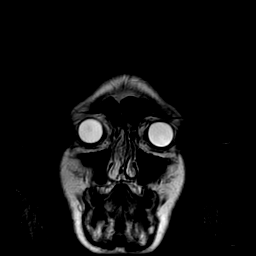

[Series 500: DWI · axial · 3.0mm · 1.09mm/px · z∈[-18,+119]mm · 5 of 49 slices shown (3 of 4)]
[im 1/49]
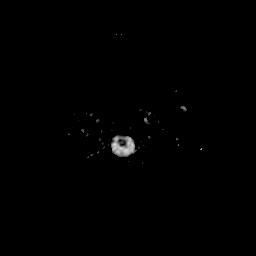
[im 13/49]
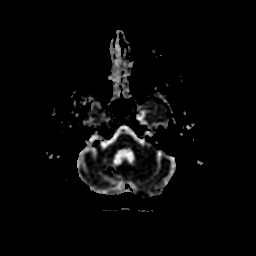
[im 25/49]
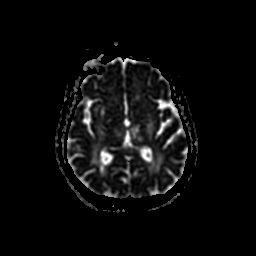
[im 37/49]
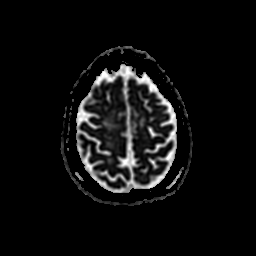
[im 49/49]
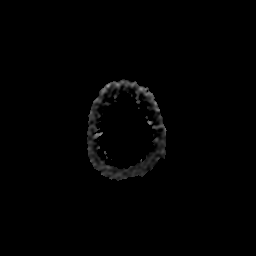

[Series 800: DWI · coronal · 4.0mm · 1.09mm/px · 4 of 41 slices shown (4 of 4)]
[im 1/41]
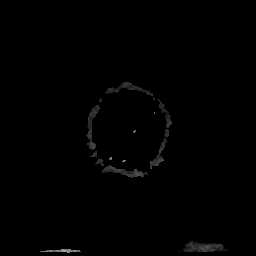
[im 14/41]
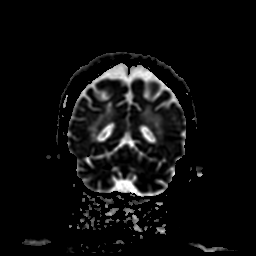
[im 27/41]
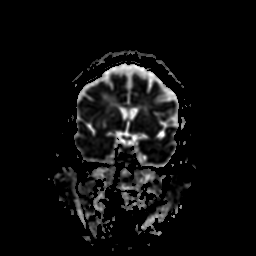
[im 41/41]
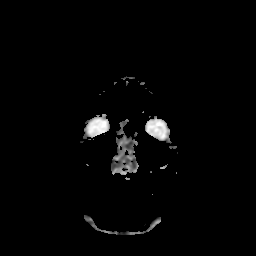

[41 of 48 positions shown; findings below may reference images not displayed]

FINDINGS: Brain: Diffuse prominence of the CSF containing spaces compatible
with generalized cerebral atrophy, advanced for age. Confluent
T2/FLAIR hyperintensity within the periventricular and deep white
matter both cerebral hemispheres, fairly advanced in nature, and
most likely related to chronic microvascular ischemic change.
Multiple remote lacunar infarcts present within the bilateral basal
ganglia/corona radiata as well as the thalami and pons. Chronic
hemosiderin staining noted about a right basal ganglia infarct.
Small remote left cerebellar infarct noted. Few additional scattered
chronic micro hemorrhages noted involving the supratentorial brain,
likely related to hypertension and/or small vessel disease.

13 mm lesion demonstrating a hypointense T2 rim within the right
cerebellar hemisphere corresponds with hyperdensity seen on prior
CT, favored to reflect a small cavernoma. No associated edema to
suggest acute hemorrhage.

No abnormal foci of restricted diffusion to suggest acute or
subacute ischemia. Gray-white matter differentiation maintained. No
acute intracranial hemorrhage.

No other mass lesion. No midline shift or mass effect. No
hydrocephalus. No extra-axial fluid collection. Pituitary gland
within normal limits.

Vascular: Major intracranial vascular flow voids are maintained.

Skull and upper cervical spine: Craniocervical junction normal.
Upper cervical spine within normal limits. Bone marrow signal
intensity within normal limits. Scalp soft tissues unremarkable.

Sinuses/Orbits: Globes and orbital soft tissues within normal
limits. Mild mucosal thickening within the ethmoidal air cells.
Paranasal sinuses are otherwise clear. No mastoid effusion.

Other: None.
IMPRESSION: 1. No acute intracranial abnormality.
2. Moderately advanced cerebral atrophy with chronic small vessel
ischemic disease, with multiple remote lacunar infarcts involving
the bilateral basal ganglia, thalami, and pons.
3. 13 mm lesion with T2 hypointense rim involving the right
cerebellar hemisphere, corresponding with previously noted
hyperdensity. Finding favored to reflect a benign cavernoma. No
evidence associated acute or recent hemorrhage.
4. Few additional scattered chronic micro hemorrhages scattered
throughout the brain, favored to be related to poorly controlled
hypertension.

## 2020-11-22 ENCOUNTER — Other Ambulatory Visit: Payer: Self-pay | Admitting: Internal Medicine

## 2020-11-22 DIAGNOSIS — Z1231 Encounter for screening mammogram for malignant neoplasm of breast: Secondary | ICD-10-CM

## 2022-11-01 ENCOUNTER — Inpatient Hospital Stay (HOSPITAL_COMMUNITY)
Admission: EM | Admit: 2022-11-01 | Discharge: 2022-11-04 | DRG: 305 | Disposition: A | Discharge status: Home Health | Payer: Medicare Other | Attending: Family Medicine | Admitting: Family Medicine

## 2022-11-01 ENCOUNTER — Emergency Department (HOSPITAL_COMMUNITY): Payer: Medicare Other

## 2022-11-01 ENCOUNTER — Other Ambulatory Visit: Payer: Self-pay

## 2022-11-01 ENCOUNTER — Encounter (HOSPITAL_COMMUNITY): Payer: Self-pay

## 2022-11-01 DIAGNOSIS — Z7982 Long term (current) use of aspirin: Secondary | ICD-10-CM | POA: Diagnosis not present

## 2022-11-01 DIAGNOSIS — I69320 Aphasia following cerebral infarction: Secondary | ICD-10-CM | POA: Diagnosis not present

## 2022-11-01 DIAGNOSIS — I674 Hypertensive encephalopathy: Secondary | ICD-10-CM | POA: Diagnosis present

## 2022-11-01 DIAGNOSIS — I700 Atherosclerosis of aorta: Secondary | ICD-10-CM | POA: Diagnosis present

## 2022-11-01 DIAGNOSIS — Z8249 Family history of ischemic heart disease and other diseases of the circulatory system: Secondary | ICD-10-CM | POA: Diagnosis not present

## 2022-11-01 DIAGNOSIS — I259 Chronic ischemic heart disease, unspecified: Secondary | ICD-10-CM | POA: Diagnosis present

## 2022-11-01 DIAGNOSIS — N1831 Chronic kidney disease, stage 3a: Secondary | ICD-10-CM | POA: Diagnosis present

## 2022-11-01 DIAGNOSIS — I69321 Dysphasia following cerebral infarction: Secondary | ICD-10-CM

## 2022-11-01 DIAGNOSIS — F209 Schizophrenia, unspecified: Secondary | ICD-10-CM | POA: Diagnosis present

## 2022-11-01 DIAGNOSIS — I129 Hypertensive chronic kidney disease with stage 1 through stage 4 chronic kidney disease, or unspecified chronic kidney disease: Secondary | ICD-10-CM | POA: Diagnosis present

## 2022-11-01 DIAGNOSIS — I10 Essential (primary) hypertension: Secondary | ICD-10-CM | POA: Diagnosis present

## 2022-11-01 DIAGNOSIS — I160 Hypertensive urgency: Secondary | ICD-10-CM | POA: Diagnosis present

## 2022-11-01 DIAGNOSIS — E876 Hypokalemia: Secondary | ICD-10-CM | POA: Diagnosis present

## 2022-11-01 DIAGNOSIS — Z79899 Other long term (current) drug therapy: Secondary | ICD-10-CM

## 2022-11-01 DIAGNOSIS — E785 Hyperlipidemia, unspecified: Secondary | ICD-10-CM | POA: Diagnosis present

## 2022-11-01 DIAGNOSIS — D751 Secondary polycythemia: Secondary | ICD-10-CM | POA: Diagnosis present

## 2022-11-01 DIAGNOSIS — R7989 Other specified abnormal findings of blood chemistry: Secondary | ICD-10-CM

## 2022-11-01 HISTORY — DX: Essential (primary) hypertension: I10

## 2022-11-01 HISTORY — DX: Cerebral infarction, unspecified: I63.9

## 2022-11-01 LAB — CBC
HCT: 46.4 % — ABNORMAL HIGH (ref 36.0–46.0)
HCT: 42 % (ref 36.0–46.0)
Hemoglobin: 15.3 g/dL — ABNORMAL HIGH (ref 12.0–15.0)
Hemoglobin: 14.1 g/dL (ref 12.0–15.0)
MCH: 30.3 pg (ref 26.0–34.0)
MCH: 30.3 pg (ref 26.0–34.0)
MCHC: 33 g/dL (ref 30.0–36.0)
MCHC: 33.6 g/dL (ref 30.0–36.0)
MCV: 91.9 fL (ref 80.0–100.0)
MCV: 90.3 fL (ref 80.0–100.0)
Platelets: 317 10*3/uL (ref 150–400)
Platelets: 293 10*3/uL (ref 150–400)
RBC: 5.05 MIL/uL (ref 3.87–5.11)
RBC: 4.65 MIL/uL (ref 3.87–5.11)
RDW: 12.9 % (ref 11.5–15.5)
RDW: 13.1 % (ref 11.5–15.5)
WBC: 9.2 10*3/uL (ref 4.0–10.5)
WBC: 14.3 10*3/uL — ABNORMAL HIGH (ref 4.0–10.5)
nRBC: 0 % (ref 0.0–0.2)
nRBC: 0 % (ref 0.0–0.2)

## 2022-11-01 LAB — URINALYSIS, ROUTINE W REFLEX MICROSCOPIC
Bilirubin Urine: NEGATIVE
Glucose, UA: NEGATIVE mg/dL
Hgb urine dipstick: NEGATIVE
Ketones, ur: NEGATIVE mg/dL
Nitrite: NEGATIVE
Protein, ur: 100 mg/dL — AB
Specific Gravity, Urine: 1.008 (ref 1.005–1.030)
pH: 8 (ref 5.0–8.0)

## 2022-11-01 LAB — BASIC METABOLIC PANEL
Anion gap: 14 (ref 5–15)
BUN: 12 mg/dL (ref 8–23)
CO2: 27 mmol/L (ref 22–32)
Calcium: 9.9 mg/dL (ref 8.9–10.3)
Chloride: 100 mmol/L (ref 98–111)
Creatinine, Ser: 1.08 mg/dL — ABNORMAL HIGH (ref 0.44–1.00)
GFR, Estimated: 56 mL/min — ABNORMAL LOW (ref >60)
Glucose, Bld: 87 mg/dL (ref 70–99)
Potassium: 3.6 mmol/L (ref 3.5–5.1)
Sodium: 141 mmol/L (ref 135–145)

## 2022-11-01 LAB — TROPONIN I (HIGH SENSITIVITY)
Troponin I (High Sensitivity): 10 ng/L (ref <18)
Troponin I (High Sensitivity): 18 ng/L — ABNORMAL HIGH (ref <18)

## 2022-11-01 LAB — CREATININE, SERUM
Creatinine, Ser: 1.13 mg/dL — ABNORMAL HIGH (ref 0.44–1.00)
GFR, Estimated: 53 mL/min — ABNORMAL LOW (ref >60)

## 2022-11-01 LAB — HIV ANTIBODY (ROUTINE TESTING W REFLEX): HIV Screen 4th Generation wRfx: NONREACTIVE

## 2022-11-01 MED ORDER — ENOXAPARIN SODIUM 40 MG/0.4ML IJ SOSY
40.0000 mg | PREFILLED_SYRINGE | INTRAMUSCULAR | Status: DC
  Administered 2022-11-02 – 2022-11-04 (×3): 40 mg via SUBCUTANEOUS
  Filled 2022-11-01 (×3): qty 0.4

## 2022-11-01 MED ORDER — ATORVASTATIN CALCIUM 10 MG PO TABS
20.0000 mg | ORAL_TABLET | Freq: Every day | ORAL | Status: DC
  Administered 2022-11-02 – 2022-11-03 (×2): 20 mg via ORAL
  Filled 2022-11-01 (×2): qty 2

## 2022-11-01 MED ORDER — CLONIDINE HCL 0.3 MG/24HR TD PTWK
0.3000 mg | MEDICATED_PATCH | TRANSDERMAL | Status: DC
  Administered 2022-11-01 – 2022-11-03 (×2): 0.3 mg via TRANSDERMAL
  Filled 2022-11-01 (×2): qty 1

## 2022-11-01 MED ORDER — LABETALOL HCL 5 MG/ML IV SOLN
20.0000 mg | INTRAVENOUS | Status: DC | PRN
  Administered 2022-11-02 (×2): 20 mg via INTRAVENOUS
  Filled 2022-11-01 (×2): qty 4

## 2022-11-01 MED ORDER — HYDRALAZINE HCL 20 MG/ML IJ SOLN: 10.0000 mg | INTRAMUSCULAR | Status: DC | PRN

## 2022-11-01 MED ORDER — HYDRALAZINE HCL 20 MG/ML IJ SOLN
5.0000 mg | Freq: Once | INTRAMUSCULAR | Status: AC
  Administered 2022-11-01: 5 mg via INTRAVENOUS
  Filled 2022-11-01: qty 1

## 2022-11-01 MED ORDER — ADULT MULTIVITAMIN W/MINERALS CH
1.0000 | ORAL_TABLET | Freq: Every day | ORAL | Status: DC
  Administered 2022-11-02 – 2022-11-04 (×3): 1 via ORAL
  Filled 2022-11-01 (×3): qty 1

## 2022-11-01 MED ORDER — LABETALOL HCL 5 MG/ML IV SOLN
10.0000 mg | Freq: Once | INTRAVENOUS | Status: AC
  Administered 2022-11-01: 10 mg via INTRAVENOUS
  Filled 2022-11-01: qty 4

## 2022-11-01 MED ORDER — ASPIRIN 81 MG PO TBEC
81.0000 mg | DELAYED_RELEASE_TABLET | Freq: Every day | ORAL | Status: DC
  Administered 2022-11-02 – 2022-11-04 (×3): 81 mg via ORAL
  Filled 2022-11-01 (×3): qty 1

## 2022-11-01 MED ORDER — AMLODIPINE BESYLATE 10 MG PO TABS
10.0000 mg | ORAL_TABLET | Freq: Every day | ORAL | Status: DC
  Administered 2022-11-01 – 2022-11-04 (×4): 10 mg via ORAL
  Filled 2022-11-01 (×4): qty 1

## 2022-11-01 MED ORDER — AMLODIPINE BESYLATE 5 MG PO TABS
10.0000 mg | ORAL_TABLET | Freq: Once | ORAL | Status: AC
  Administered 2022-11-01: 10 mg via ORAL
  Filled 2022-11-01: qty 2

## 2022-11-01 NOTE — ED Notes (Signed)
ED TO INPATIENT HANDOFF REPORT  ED Nurse Name and Phone #: Inda Merlin [phone]   S Name/Age/Gender Linda Lynch 67 y.o. female Room/Bed: 025C/025C  Code Status   Code Status: Full Code  Home/SNF/Other Home Patient oriented to: self, place, time, and situation Is this baseline? Yes   Triage Complete: Triage complete  Chief Complaint Hypertensive urgency [I16.0]  Triage Note Pt arrived to ED via EMS from home w/ c/o HTN. Per EMS pt has hx of HTN but has never taken medication to tx it. Pt is asymptomatic. Home health RN came out to house today and found pt BP to be 240/160. Pt also hx of schizophrenia and per EMS pt says she doesn't take meds for it. 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 Ambulatory w/ cane at baseline. EMS VS: 236/154, HR 112, 18RR, 99% RA.   Allergies No Known Allergies  Level of Care/Admitting Diagnosis ED Disposition     ED Disposition  Admit   Condition  --   Comment  Hospital Area: [facility] [[id number]]  Level of Care: Progressive [102]  Admit to Progressive based on following criteria: CARDIOVASCULAR & THORACIC of moderate stability with acute coronary syndrome symptoms/low risk myocardial infarction/hypertensive urgency/arrhythmias/heart failure potentially compromising stability and stable post cardiovascular intervention patients.  May admit patient to [facility] or [facility] if equivalent level of care is available:: Yes  Covid Evaluation: Asymptomatic - no recent exposure (last 10 days) testing not required  Diagnosis: Hypertensive urgency [650126]  Admitting Physician: Gertie Fey [id number]  Attending Physician: Gertie Fey [id number]  Certification:: I certify this patient will need inpatient services for at least 2 midnights  Estimated Length of Stay: 2          B Medical/Surgery History Past Medical History:  Diagnosis Date   CVA (cerebral vascular accident) ([place])    Hypertension     Schizophrenia ([place])    History reviewed. No pertinent surgical history.   A IV Location/Drains/Wounds Patient Lines/Drains/Airways Status     Active Line/Drains/Airways     Name Placement date Placement time Site Days   Peripheral IV 11/01/22 20 G Right Antecubital 11/01/22  1351  Antecubital  less than 1            Intake/Output Last 24 hours No intake or output data in the 24 hours ending 11/01/22 1945  Labs/Imaging Results for orders placed or performed during the hospital encounter of 11/01/22 (from the past 48 hour(s))  Basic metabolic panel     Status: Abnormal   Collection Time: 11/01/22  1:51 PM  Result Value Ref Range   Sodium 141 135 - 145 mmol/L   Potassium 3.6 3.5 - 5.1 mmol/L   Chloride 100 98 - 111 mmol/L   CO2 27 22 - 32 mmol/L   Glucose, Bld 87 70 - 99 mg/dL    Comment: Glucose reference range applies only to samples taken after fasting for at least 8 hours.   BUN 12 8 - 23 mg/dL   Creatinine, Ser 1.08 (H) 0.44 - 1.00 mg/dL   Calcium 9.9 8.9 - 10.3 mg/dL   GFR, Estimated 56 (L) >60 mL/min    Comment: (NOTE) Calculated using the CKD-EPI Creatinine Equation (2021)    Anion gap 14 5 - 15    Comment: Performed at [facility] [address], [place], [place] [address]  CBC     Status: Abnormal   Collection Time: 11/01/22  1:51  PM  Result Value Ref Range   WBC 9.2 4.0 - 10.5 K/uL   RBC 5.05 3.87 - 5.11 MIL/uL   Hemoglobin 15.3 (H) 12.0 - 15.0 g/dL   HCT 46.4 (H) 36.0 - 46.0 %   MCV 91.9 80.0 - 100.0 fL   MCH 30.3 26.0 - 34.0 pg   MCHC 33.0 30.0 - 36.0 g/dL   RDW 12.9 11.5 - 15.5 %   Platelets 317 150 - 400 K/uL   nRBC 0.0 0.0 - 0.2 %    Comment: Performed at [facility] [address], [place], [place] [address]  Troponin I (High Sensitivity)     Status: None   Collection Time: 11/01/22  1:51 PM  Result Value Ref Range   Troponin I (High Sensitivity) 10 <18 ng/L    Comment: (NOTE) Elevated high sensitivity troponin I (hsTnI)  values and significant  changes across serial measurements may suggest ACS but many other  chronic and acute conditions are known to elevate hsTnI results.  Refer to the "Links" section for chest pain algorithms and additional  guidance. Performed at [facility], [place] [address], [place], [place] [address]   Urinalysis, Routine w reflex microscopic -Urine, Clean Catch     Status: Abnormal   Collection Time: 11/01/22  2:55 PM  Result Value Ref Range   Color, Urine STRAW (A) YELLOW   APPearance CLEAR CLEAR   Specific Gravity, Urine 1.008 1.005 - 1.030   pH 8.0 5.0 - 8.0   Glucose, UA NEGATIVE NEGATIVE mg/dL   Hgb urine dipstick NEGATIVE NEGATIVE   Bilirubin Urine NEGATIVE NEGATIVE   Ketones, ur NEGATIVE NEGATIVE mg/dL   Protein, ur 100 (A) NEGATIVE mg/dL   Nitrite NEGATIVE NEGATIVE   Leukocytes,Ua TRACE (A) NEGATIVE   RBC / HPF 0-5 0 - 5 RBC/hpf   WBC, UA 0-5 0 - 5 WBC/hpf   Bacteria, UA RARE (A) NONE SEEN   Squamous Epithelial / HPF 0-5 0 - 5 /HPF   Mucus PRESENT     Comment: Performed at [facility], [address], [place], [place] [address]  Troponin I (High Sensitivity)     Status: Abnormal   Collection Time: 11/01/22  4:12 PM  Result Value Ref Range   Troponin I (High Sensitivity) 18 (H) <18 ng/L    Comment: (NOTE) Elevated high sensitivity troponin I (hsTnI) values and significant  changes across serial measurements may suggest ACS but many other  chronic and acute conditions are known to elevate hsTnI results.  Refer to the "Links" section for chest pain algorithms and additional  guidance. Performed at [facility], [place] [address] [place], [place] [address]    CT Head Wo Contrast  Result Date: 11/01/2022 CLINICAL DATA:  Headache and hypertension EXAM: CT HEAD WITHOUT CONTRAST TECHNIQUE: Contiguous axial images were obtained from the base of the skull through the vertex without intravenous contrast. RADIATION DOSE REDUCTION: This exam was  performed according to the departmental dose-optimization program which includes automated exposure control, adjustment of the mA and/or kV according to patient size and/or use of iterative reconstruction technique. COMPARISON:  Brain MRI 05/18/2018 FINDINGS: Brain: There is no acute intracranial hemorrhage, extra-axial fluid collection, or acute infarct. There is mild and infratentorial volume loss with prominence of the ventricular system and extra-axial CSF spaces, similar to the prior brain MRI. There are remote infarcts in the bilateral basal ganglia and thalami. Additional confluent hypodensity in the supratentorial white matter likely reflects  sequela of chronic small-vessel ischemic change. 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 There is no other mass lesion. There is no mass effect or midline shift. Vascular: No hyperdense vessel or unexpected calcification. Skull: Normal. Negative for fracture or focal lesion. Sinuses/Orbits: The imaged paranasal sinuses are clear. The globes and orbits are unremarkable. Other: None. IMPRESSION: No acute intracranial pathology. Electronically Signed   By: Valetta Mole M.D.   On: 11/01/2022 15:59    Pending Labs Unresulted Labs (From admission, onward)     Start     Ordered   11/02/22 0500  Metanephrines, plasma  Tomorrow morning,   R        11/01/22 1934   11/02/22 0500  Aldosterone + renin activity w/ ratio  Tomorrow morning,   R        11/01/22 1934   11/02/22 0500  TSH  Tomorrow morning,   R        11/01/22 1934   Signed and Held  HIV Antibody (routine testing w rflx)  (HIV Antibody (Routine testing w reflex) panel)  Once,   R        Signed and Held   Signed and Held  CBC  (enoxaparin (LOVENOX)    CrCl >/= 30 ml/min)  Once,   R       Comments: Baseline for enoxaparin therapy IF NOT ALREADY DRAWN.  Notify MD if PLT < 100 K.    Signed and Held    Signed and Held  Creatinine, serum  (enoxaparin (LOVENOX)    CrCl >/= 30 ml/min)  Once,   R       Comments: Baseline for enoxaparin therapy IF NOT ALREADY DRAWN.    Signed and Held   Signed and Held  Creatinine, serum  (enoxaparin (LOVENOX)    CrCl >/= 30 ml/min)  Weekly,   R     Comments: while on enoxaparin therapy    Signed and Held   Signed and Held  Protime-INR  Tomorrow morning,   R        Signed and Held   Signed and Held  APTT  Tomorrow morning,   R        Signed and Held   Signed and Occupational hygienist morning,   R        Signed and Held   Signed and Held  CBC  Tomorrow morning,   R        Signed and Held            Vitals/Pain Today's Vitals   11/01/22 1845 11/01/22 1900 11/01/22 1915 11/01/22 1930  BP: (!) 180/67 (!) 186/65 (!) 147/91 (!) 157/73  Pulse: 72 67 77 68  Resp: 17 20 18 17   Temp:      TempSrc:      SpO2: 99% 98% 100% 99%  Weight:      Height:      PainSc:        Isolation Precautions No active isolations  Medications Medications  labetalol (NORMODYNE) injection 10 mg (10 mg Intravenous Given 11/01/22 1400)  hydrALAZINE (APRESOLINE) injection 5 mg (5 mg Intravenous Given 11/01/22 1632)  amLODipine (NORVASC) tablet 10 mg (10 mg Oral Given 11/01/22 1634)  hydrALAZINE (APRESOLINE) injection 5 mg (5 mg Intravenous Given 11/01/22 1750)    Mobility walks with device     Focused Assessments Cardiac Assessment Handoff:  Lab Results  Component Value Date   TROPONINI 0.08 ([place]) 05/21/2018   No results found for: "DDIMER" Does the Patient currently have chest pain? No    R Recommendations: See Admitting Provider Note  Report given to:   Additional Notes: Pt non compliant with blood pressure medications. Pt comes in with blurred vision. Hx of a prior stroke.

## 2022-11-01 NOTE — Assessment & Plan Note (Signed)
Check erythropoietin level - as an outpatient. Patient has had chronic erythropoesis based on record review.

## 2022-11-01 NOTE — ED Provider Notes (Signed)
Accepted handoff at shift change from [facility]. Please see prior provider note for full HPI.  Briefly: Patient is a 67 y.o. female who presents to the ER for hypertension. Hx of HTN and schizophrenia, reports doesn't take medications for either. Hx of CVA with residual dysphasia and aphasia, ambulatory with cane at baseline. Came in hypertensive to 270/111.  DDX/Plan: Received 10 mg labetalol, BP improved to 193/81. Overall asymptomatic from HTN perspective, no CP, headache, SOB, peripheral edema. Follow up on labs, likely admit for BP management.   Physical Exam  BP (!) 193/81   Pulse (!) 59   Temp 98.1 F (36.7 C) (Oral)   Resp 15   Ht 5\' 4"  (1.626 m)   Wt 68 kg   SpO2 100%   BMI 25.75 kg/m   Physical Exam Vitals and nursing note reviewed.  Constitutional:      Appearance: Normal appearance.  HENT:     Head: Normocephalic and atraumatic.  Eyes:     Conjunctiva/sclera: Conjunctivae normal.  Pulmonary:     Effort: Pulmonary effort is normal. No respiratory distress.  Musculoskeletal:     Right lower leg: No edema.     Left lower leg: No edema.  Skin:    General: Skin is warm and dry.  Neurological:     Mental Status: She is alert.  Psychiatric:        Mood and Affect: Mood normal.        Behavior: Behavior normal.    Results   Results for orders placed or performed during the hospital encounter of 0000000  Basic metabolic panel  Result Value Ref Range   Sodium 141 135 - 145 mmol/L   Potassium 3.6 3.5 - 5.1 mmol/L   Chloride 100 98 - 111 mmol/L   CO2 27 22 - 32 mmol/L   Glucose, Bld 87 70 - 99 mg/dL   BUN 12 8 - 23 mg/dL   Creatinine, Ser 1.08 (H) 0.44 - 1.00 mg/dL   Calcium 9.9 8.9 - 10.3 mg/dL   GFR, Estimated 56 (L) >60 mL/min   Anion gap 14 5 - 15  CBC  Result Value Ref Range   WBC 9.2 4.0 - 10.5 K/uL   RBC 5.05 3.87 - 5.11 MIL/uL   Hemoglobin 15.3 (H) 12.0 - 15.0 g/dL   HCT 46.4 (H) 36.0 - 46.0 %   MCV 91.9 80.0 - 100.0 fL   MCH 30.3 26.0 - 34.0 pg    MCHC 33.0 30.0 - 36.0 g/dL   RDW 12.9 11.5 - 15.5 %   Platelets 317 150 - 400 K/uL   nRBC 0.0 0.0 - 0.2 %  Urinalysis, Routine w reflex microscopic -Urine, Clean Catch  Result Value Ref Range   Color, Urine STRAW (A) YELLOW   APPearance CLEAR CLEAR   Specific Gravity, Urine 1.008 1.005 - 1.030   pH 8.0 5.0 - 8.0   Glucose, UA NEGATIVE NEGATIVE mg/dL   Hgb urine dipstick NEGATIVE NEGATIVE   Bilirubin Urine NEGATIVE NEGATIVE   Ketones, ur NEGATIVE NEGATIVE mg/dL   Protein, ur 100 (A) NEGATIVE mg/dL   Nitrite NEGATIVE NEGATIVE   Leukocytes,Ua TRACE (A) NEGATIVE   RBC / HPF 0-5 0 - 5 RBC/hpf   WBC, UA 0-5 0 - 5 WBC/hpf   Bacteria, UA RARE (A) NONE SEEN   Squamous Epithelial / HPF 0-5 0 - 5 /HPF   Mucus PRESENT   Troponin I (High Sensitivity)  Result Value Ref Range   Troponin I (High  Sensitivity) 10 <18 ng/L  Troponin I (High Sensitivity)  Result Value Ref Range   Troponin I (High Sensitivity) 18 (H) <18 ng/L   CT Head Wo Contrast  Result Date: 11/01/2022 CLINICAL DATA:  Headache and hypertension EXAM: CT HEAD WITHOUT CONTRAST TECHNIQUE: Contiguous axial images were obtained from the base of the skull through the vertex without intravenous contrast. RADIATION DOSE REDUCTION: This exam was performed according to the departmental dose-optimization program which includes automated exposure control, adjustment of the mA and/or kV according to patient size and/or use of iterative reconstruction technique. COMPARISON:  Brain MRI 05/18/2018 FINDINGS: Brain: There is no acute intracranial hemorrhage, extra-axial fluid collection, or acute infarct. There is mild and infratentorial volume loss with prominence of the ventricular system and extra-axial CSF spaces, similar to the prior brain MRI. There are remote infarcts in the bilateral basal ganglia and thalami. Additional confluent hypodensity in the supratentorial white matter likely reflects sequela of chronic small-vessel ischemic change.  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 There is no other mass lesion. There is no mass effect or midline shift. Vascular: No hyperdense vessel or unexpected calcification. Skull: Normal. Negative for fracture or focal lesion. Sinuses/Orbits: The imaged paranasal sinuses are clear. The globes and orbits are unremarkable. Other: None. IMPRESSION: No acute intracranial pathology. Electronically Signed   By: Valetta Mole M.D.   On: 11/01/2022 15:59    ED Course / MDM    Medical Decision Making Amount and/or Complexity of Data Reviewed Labs: ordered. Radiology: ordered.  Risk Prescription drug management.   1600 -- Advised by nursing staff blood pressure had risen to 214/84 again. Given 5 mg hydralazine IV and 10 mg amlodipine PO.   1615 -- Delta troponin 18 compared to prior 10  1745 -- Discussed with patient plan for admission. BP risen to 253/104 after medications given. Reports blurry vision over the past few months, but denies any active symptoms at this time. States she has not been getting her blood pressure medicine because her brother who she lives with, has not been getting them/giving them to her. Patient given another 5 mg of hydralazine.   I consulted with hospitalist Dr Elvia Collum who will admit. He is considering starting patient on antihypertensive drip and admitting to ICU, but will determine after evaluating patient. The patient appears reasonably stabilized for admission considering the current resources, flow, and capabilities available in the ED at this time, and I doubt any other [facility] requiring further screening and/or treatment in the ED prior to admission.  I discussed this case with my attending physician Dr. Zenia Resides who cosigned this note including patient's presenting symptoms, physical exam, and planned diagnostics and interventions. Attending physician stated agreement  with plan or made changes to plan which were implemented.     Estill Cotta 11/01/22 Randol Kern    Lacretia Leigh, MD 11/04/22 [phone]

## 2022-11-01 NOTE — ED Triage Notes (Signed)
Pt arrived to ED via EMS from home w/ c/o HTN. Per EMS pt has hx of HTN but has never taken medication to tx it. Pt is asymptomatic. Home health RN came out to house today and found pt BP to be 240/160. Pt also hx of schizophrenia and per EMS pt says she doesn't take meds for it. 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 Ambulatory w/ cane at baseline. EMS VS: 236/154, HR 112, 18RR, 99% RA.

## 2022-11-01 NOTE — H&P (Signed)
History and Physical    Patient: Linda Lynch [id number] DOB: 12/11/1955 DOA: 11/01/2022 DOS: the patient was seen and examined on 11/01/2022 PCP: Patient, No Pcp Per  Patient coming from: Home  Chief Complaint:  Chief Complaint  Patient presents with   Hypertension   HPI: Linda Lynch is a 67 y.o. female with medical history significant of prior hypertension as well as prior documented history of hypertensive urgency is in the chart.  Patient reports that she has been taking her medications as prescribed as well as not having any symptoms such as chest pain shortness of breath blurry vision etc.  However patient had a visit from an RN today.  It is not entirely clear why the RN visited I suspect it was a home Doctor, general practice.  Routine vitals revealed that the patient was markedly hypertensive at home.  Patient was transferred to [facility] ER.  Patient remains totally asymptomatic here and offers no complaints.  Please note that review of medications by pharmacist revealed that the patient is likely not taking her medications at home.  Patient told as much to her ER provider.  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  Review of Systems: As mentioned in the history of present illness. All other systems reviewed and are negative. Past Medical History:  Diagnosis Date   CVA (cerebral vascular accident) ([place])    Hypertension    Schizophrenia ([place])    History reviewed. No pertinent surgical history. Social History:  reports that she has never smoked. She does not have any smokeless tobacco history on file. She reports that she does not drink alcohol and does not use drugs.  No Known Allergies  Family History  Problem Relation Age of Onset   Hypertension Mother     Prior to Admission medications   Medication Sig Start Date End Date Taking? Authorizing Provider  aspirin EC 81 MG EC tablet Take 1 tablet (81 mg total) by mouth daily. 05/27/18  Yes Aline August,  MD  diltiazem (CARDIZEM CD) 300 MG 24 hr capsule Take 300 mg by mouth daily. 06/06/22  Yes [provider]  Multiple Vitamin (MULTIVITAMIN) tablet Take 2 tablets by mouth daily.   Yes [provider]  Multiple Vitamins-Minerals (MULTIVITAMIN GUMMIES ADULT) CHEW Chew 2 each by mouth daily.   Yes [provider]  amLODipine (NORVASC) 10 MG tablet Take 1 tablet (10 mg total) by mouth daily. Patient not taking: Reported on 11/01/2022 05/27/18   Aline August, MD  atorvastatin (LIPITOR) 20 MG tablet Take 1 tablet (20 mg total) by mouth daily at 6 PM. Patient not taking: Reported on 11/01/2022 05/27/18   Aline August, MD  metoprolol tartrate (LOPRESSOR) 25 MG tablet Take 1 tablet (25 mg total) by mouth 2 (two) times daily. Patient not taking: Reported on 11/01/2022 05/27/18   Aline August, MD    Physical Exam: Vitals:   11/01/22 1750 11/01/22 1830 11/01/22 1845 11/01/22 1900  BP: (!) 253/104 (!) 188/82 (!) 180/67 (!) 186/65  Pulse:  77 72 67  Resp:  19 17 20   Temp:      TempSrc:      SpO2:  100% 99% 98%  Weight:      Height:       General: Patient does not appear to be in any distress Respiratory exam: Bilateral air entry vesicular Cardiovascular exam S1-S2 normal Abdomen soft nontender Extremities warm without edema. General patient is alert awake giving a coherent account of things however it does  seem to change between different interviewers.  She seems to have no focal motor deficit, is calm, however speaks below slow and has slight dysarthria which makes it difficult to understand and slight aphasia in which she takes time to figure out her words that she wants to speak. Data Reviewed:  Labs on Admission:  Results for orders placed or performed during the hospital encounter of 11/01/22 (from the past 24 hour(s))  Basic metabolic panel     Status: Abnormal   Collection Time: 11/01/22  1:51 PM  Result Value Ref Range   Sodium 141 135 - 145 mmol/L    Potassium 3.6 3.5 - 5.1 mmol/L   Chloride 100 98 - 111 mmol/L   CO2 27 22 - 32 mmol/L   Glucose, Bld 87 70 - 99 mg/dL   BUN 12 8 - 23 mg/dL   Creatinine, Ser 1.08 (H) 0.44 - 1.00 mg/dL   Calcium 9.9 8.9 - 10.3 mg/dL   GFR, Estimated 56 (L) >60 mL/min   Anion gap 14 5 - 15  CBC     Status: Abnormal   Collection Time: 11/01/22  1:51 PM  Result Value Ref Range   WBC 9.2 4.0 - 10.5 K/uL   RBC 5.05 3.87 - 5.11 MIL/uL   Hemoglobin 15.3 (H) 12.0 - 15.0 g/dL   HCT 46.4 (H) 36.0 - 46.0 %   MCV 91.9 80.0 - 100.0 fL   MCH 30.3 26.0 - 34.0 pg   MCHC 33.0 30.0 - 36.0 g/dL   RDW 12.9 11.5 - 15.5 %   Platelets 317 150 - 400 K/uL   nRBC 0.0 0.0 - 0.2 %  Troponin I (High Sensitivity)     Status: None   Collection Time: 11/01/22  1:51 PM  Result Value Ref Range   Troponin I (High Sensitivity) 10 <18 ng/L  Urinalysis, Routine w reflex microscopic -Urine, Clean Catch     Status: Abnormal   Collection Time: 11/01/22  2:55 PM  Result Value Ref Range   Color, Urine STRAW (A) YELLOW   APPearance CLEAR CLEAR   Specific Gravity, Urine 1.008 1.005 - 1.030   pH 8.0 5.0 - 8.0   Glucose, UA NEGATIVE NEGATIVE mg/dL   Hgb urine dipstick NEGATIVE NEGATIVE   Bilirubin Urine NEGATIVE NEGATIVE   Ketones, ur NEGATIVE NEGATIVE mg/dL   Protein, ur 100 (A) NEGATIVE mg/dL   Nitrite NEGATIVE NEGATIVE   Leukocytes,Ua TRACE (A) NEGATIVE   RBC / HPF 0-5 0 - 5 RBC/hpf   WBC, UA 0-5 0 - 5 WBC/hpf   Bacteria, UA RARE (A) NONE SEEN   Squamous Epithelial / HPF 0-5 0 - 5 /HPF   Mucus PRESENT   Troponin I (High Sensitivity)     Status: Abnormal   Collection Time: 11/01/22  4:12 PM  Result Value Ref Range   Troponin I (High Sensitivity) 18 (H) <18 ng/L    Radiological Exams on Admission:  CT Head Wo Contrast  Result Date: 11/01/2022 CLINICAL DATA:  Headache and hypertension EXAM: CT HEAD WITHOUT CONTRAST TECHNIQUE: Contiguous axial images were obtained from the base of the skull through the vertex without  intravenous contrast. RADIATION DOSE REDUCTION: This exam was performed according to the departmental dose-optimization program which includes automated exposure control, adjustment of the mA and/or kV according to patient size and/or use of iterative reconstruction technique. COMPARISON:  Brain MRI 05/18/2018 FINDINGS: Brain: There is no acute intracranial hemorrhage, extra-axial fluid collection, or acute infarct. There is mild and infratentorial volume  loss with prominence of the ventricular system and extra-axial CSF spaces, similar to the prior brain MRI. There are remote infarcts in the bilateral basal ganglia and thalami. Additional confluent hypodensity in the supratentorial white matter likely reflects sequela of chronic small-vessel ischemic change. 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 There is no other mass lesion. There is no mass effect or midline shift. Vascular: No hyperdense vessel or unexpected calcification. Skull: Normal. Negative for fracture or focal lesion. Sinuses/Orbits: The imaged paranasal sinuses are clear. The globes and orbits are unremarkable. Other: None. IMPRESSION: No acute intracranial pathology. Electronically Signed   By: Valetta Mole M.D.   On: 11/01/2022 15:59    EKG: Independently reviewed. Sinus tachy. pvc   Assessment and Plan: * Hypertensive urgency Given patient's history that is available that the patient has likely not been taking her medications for a long time, I suspect patient has had high blood pressure for prolonged.  Of time.  At this time we will target systolic blood pressure under or around 160 mmHg.  At this time I can do a limited workup of secondary hypertensive etiologies check metanephrine, TSH, aldosterone and renin.  Suspect this is rebound hypertension from.  Patient discontinuing her beta-blockers.  Will treat with intravenous  labetalol and hydralazine acutely.  Since patient is not adhering very well to medications, will trial a longer acting medication such as clonidine patch.  Erythrocytosis Check erythropoietin level - as an outpatient. Patient has had chronic erythropoesis based on record review.      Advance Care Planning:   Code Status: Prior full code  Consults: NA  Family Communication: per pateint.  Severity of Illness: The appropriate patient status for this patient is INPATIENT. Inpatient status is judged to be reasonable and necessary in order to provide the required intensity of service to ensure the patient's safety. The patient's presenting symptoms, physical exam findings, and initial radiographic and laboratory data in the context of their chronic comorbidities is felt to place them at high risk for further clinical deterioration. Furthermore, it is not anticipated that the patient will be medically stable for discharge from the hospital within 2 midnights of admission.   * I certify that at the point of admission it is my clinical judgment that the patient will require inpatient hospital care spanning beyond 2 midnights from the point of admission due to high intensity of service, high risk for further deterioration and high frequency of surveillance required.*  Author: Gertie Fey, MD 11/01/2022 7:22 PM  For on call review [email or web address].

## 2022-11-01 NOTE — ED Provider Notes (Signed)
[facility] Provider Note   CSN: [id number] Arrival date & time: 11/01/22  1329     History  Chief Complaint  Patient presents with   Hypertension    Linda Lynch is a 67 y.o. female with medical history of schizophrenia, hypertension, CVA with resulting aphasia and dysphagia.  Patient presents to ED for evaluation of hypertension.  Patient reports that her home health nurse came out today and noted that her blood pressure was very elevated.  Patient has a history of hypertension in her chart however denies ever taking medication for hypertension.  Patient denies chest pain, shortness of breath, headache or blurred vision.  Patient denies nausea, vomiting, diarrhea, fevers, abdominal pain.   Hypertension Pertinent negatives include no chest pain, no headaches and no shortness of breath.       Home Medications Prior to Admission medications   Medication Sig Start Date End Date Taking? Authorizing Provider  Cholecalciferol 125 MCG (5000 UT) TABS Take 1 tablet every day by oral route. 04/16/19  Yes [provider]  diltiazem (CARDIZEM CD) 300 MG 24 hr capsule Take 300 mg by mouth daily. 06/06/22  Yes [provider]  amLODipine (NORVASC) 10 MG tablet Take 1 tablet (10 mg total) by mouth daily. 05/27/18   Aline August, MD  aspirin EC 81 MG EC tablet Take 1 tablet (81 mg total) by mouth daily. 05/27/18   Aline August, MD  atorvastatin (LIPITOR) 20 MG tablet Take 1 tablet (20 mg total) by mouth daily at 6 PM. 05/27/18   Aline August, MD  metoprolol tartrate (LOPRESSOR) 25 MG tablet Take 1 tablet (25 mg total) by mouth 2 (two) times daily. 05/27/18   Aline August, MD  senna-docusate (SENOKOT-S) 8.6-50 MG tablet Take 1 tablet by mouth 2 (two) times daily. 05/27/18   Aline August, MD      Allergies    Patient has no known allergies.    Review of Systems   Review of Systems  Eyes:  Negative for visual  disturbance.  Respiratory:  Negative for shortness of breath.   Cardiovascular:  Negative for chest pain.  Neurological:  Negative for headaches.  All other systems reviewed and are negative.   Physical Exam Updated Vital Signs BP (!) 193/81   Pulse (!) 59   Temp 98.1 F (36.7 C) (Oral)   Resp 15   Ht 5\' 4"  (1.626 m)   Wt 68 kg   SpO2 100%   BMI 25.75 kg/m  Physical Exam Vitals and nursing note reviewed.  Constitutional:      General: She is not in acute distress.    Appearance: She is not ill-appearing, toxic-appearing or diaphoretic.  HENT:     Head: Normocephalic and atraumatic.     Nose: Nose normal.     Mouth/Throat:     Mouth: Mucous membranes are moist.     Pharynx: Oropharynx is clear.  Eyes:     Extraocular Movements: Extraocular movements intact.     Conjunctiva/sclera: Conjunctivae normal.     Pupils: Pupils are equal, round, and reactive to light.  Cardiovascular:     Rate and Rhythm: Normal rate and regular rhythm.  Pulmonary:     Effort: Pulmonary effort is normal.     Breath sounds: Normal breath sounds. No wheezing.  Abdominal:     General: Abdomen is flat. Bowel sounds are normal.     Palpations: Abdomen is soft.     Tenderness: There is no  abdominal tenderness.  Musculoskeletal:     Cervical back: Normal range of motion and neck supple. No tenderness.  Skin:    General: Skin is warm and dry.     Capillary Refill: Capillary refill takes less than 2 seconds.  Neurological:     General: No focal deficit present.     Mental Status: She is alert and oriented to person, place, and time.     GCS: GCS eye subscore is 4. GCS verbal subscore is 5. GCS motor subscore is 6.     Cranial Nerves: Cranial nerves 2-12 are intact. No cranial nerve deficit.     Sensory: Sensation is intact. No sensory deficit.     Motor: Motor function is intact. No weakness.     Coordination: Coordination is intact. Heel to Shin Test normal.     Comments: Baseline dysphagia,  aphasia     ED Results / Procedures / Treatments   Labs (all labs ordered are listed, but only abnormal results are displayed) Labs Reviewed  CBC - Abnormal; Notable for the following components:      Result Value   Hemoglobin 15.3 (*)    HCT 46.4 (*)    All other components within normal limits  URINALYSIS, ROUTINE W REFLEX MICROSCOPIC - Abnormal; Notable for the following components:   Color, Urine STRAW (*)    Protein, ur 100 (*)    Leukocytes,Ua TRACE (*)    Bacteria, UA RARE (*)    All other components within normal limits  BASIC METABOLIC PANEL  TROPONIN I (HIGH SENSITIVITY)  TROPONIN I (HIGH SENSITIVITY)    EKG None  Radiology No results found.  Procedures Procedures   Medications Ordered in ED Medications  labetalol (NORMODYNE) injection 10 mg (10 mg Intravenous Given 11/01/22 1400)    ED Course/ Medical Decision Making/ A&P  Medical Decision Making Amount and/or Complexity of Data Reviewed Labs: ordered.   67 year old female presents to ED for evaluation of hypertension.  Please see HPI for further details.  On examination patient is afebrile and nontachycardic.  Lung sounds clear bilaterally, not hypoxic.  Abdomen soft and compressible throughout.  Neurological examination at baseline for patient.  CBC with no leukocytosis, no anemia.  Urinalysis unremarkable besides trace leukocytes, patient denies dysuria.  Troponin pending at this time, BMP pending at this time.  Patient provided 10mg  labetalol for BP control. Patient BP has reduced at this time. Patient will be signed out to oncoming provider pending lab results, CT head. Patient stable.    Final Clinical Impression(s) / ED Diagnoses Final diagnoses:  Hypertension, unspecified type    Rx / DC Orders ED Discharge Orders     None         Azucena Cecil, PA-C 11/01/22 1529    Cristie Hem, MD 11/02/22 1325

## 2022-11-01 NOTE — Assessment & Plan Note (Signed)
Given patient's history that is available that the patient has likely not been taking her medications for a long time, I suspect patient has had high blood pressure for prolonged.  Of time.  At this time we will target systolic blood pressure under or around 160 mmHg.  At this time I can do a limited workup of secondary hypertensive etiologies check metanephrine, TSH, aldosterone and renin.  Suspect this is rebound hypertension from.  Patient discontinuing her beta-blockers.  Will treat with intravenous labetalol and hydralazine acutely.  Since patient is not adhering very well to medications, will trial a longer acting medication such as clonidine patch.

## 2022-11-01 NOTE — ED Notes (Signed)
[place] PA notified and aware of rising BP

## 2022-11-02 ENCOUNTER — Inpatient Hospital Stay (HOSPITAL_COMMUNITY): Payer: Medicare Other

## 2022-11-02 DIAGNOSIS — I160 Hypertensive urgency: Secondary | ICD-10-CM | POA: Diagnosis not present

## 2022-11-02 LAB — CBC
HCT: 39.3 % (ref 36.0–46.0)
Hemoglobin: 13.7 g/dL (ref 12.0–15.0)
MCH: 30.6 pg (ref 26.0–34.0)
MCHC: 34.9 g/dL (ref 30.0–36.0)
MCV: 87.7 fL (ref 80.0–100.0)
Platelets: 284 10*3/uL (ref 150–400)
RBC: 4.48 MIL/uL (ref 3.87–5.11)
RDW: 13.1 % (ref 11.5–15.5)
WBC: 11.2 10*3/uL — ABNORMAL HIGH (ref 4.0–10.5)
nRBC: 0 % (ref 0.0–0.2)

## 2022-11-02 LAB — BASIC METABOLIC PANEL
Anion gap: 8 (ref 5–15)
BUN: 15 mg/dL (ref 8–23)
CO2: 27 mmol/L (ref 22–32)
Calcium: 9.1 mg/dL (ref 8.9–10.3)
Chloride: 104 mmol/L (ref 98–111)
Creatinine, Ser: 1.15 mg/dL — ABNORMAL HIGH (ref 0.44–1.00)
GFR, Estimated: 52 mL/min — ABNORMAL LOW (ref >60)
Glucose, Bld: 118 mg/dL — ABNORMAL HIGH (ref 70–99)
Potassium: 3.4 mmol/L — ABNORMAL LOW (ref 3.5–5.1)
Sodium: 139 mmol/L (ref 135–145)

## 2022-11-02 LAB — APTT: aPTT: 30 seconds (ref 24–36)

## 2022-11-02 LAB — PROTIME-INR
INR: 1.1 (ref 0.8–1.2)
Prothrombin Time: 13.7 seconds (ref 11.4–15.2)

## 2022-11-02 LAB — TSH: TSH: 1.905 u[IU]/mL (ref 0.350–4.500)

## 2022-11-02 MED ORDER — ONDANSETRON HCL 4 MG/2ML IJ SOLN
4.0000 mg | Freq: Four times a day (QID) | INTRAMUSCULAR | Status: DC | PRN
  Filled 2022-11-02: qty 2

## 2022-11-02 MED ORDER — POTASSIUM CHLORIDE CRYS ER 20 MEQ PO TBCR
40.0000 meq | EXTENDED_RELEASE_TABLET | Freq: Once | ORAL | Status: AC
  Administered 2022-11-02: 40 meq via ORAL
  Filled 2022-11-02: qty 2

## 2022-11-02 NOTE — Progress Notes (Signed)
  Progress Note   Patient: Linda Lynch [id number] DOB: 1956-05-10 DOA: 11/01/2022     1 DOS: the patient was seen and examined on 11/02/2022   Brief hospital course: Linda Lynch is a 67 y.o. female with medical history significant of prior hypertension as well as prior documented history of hypertensive urgency is in the chart.  Patient reports that she has been taking her medications as prescribed as well as not having any symptoms such as chest pain shortness of breath blurry vision etc.  However patient had a visit from an RN today.  It is not entirely clear why the RN visited I suspect it was a home Doctor, general practice.  Routine vitals revealed that the patient was markedly hypertensive at home.  Patient was transferred to [facility] ER.   Assessment and Plan:  # Acute encephalopathy most likely due to poorly controlled hypertension and/or infection* Hypertensive urgency, POA, active - Patient is only oriented to self and is unable to specify why she is in the hospital where she is.  Her blood pressure is better controlled this morning and her mental status is felt to improve.  At this time we will pursue further encephalopathy workup including infectious etiologies, chest x-ray.  In addition will obtain MRI head due to concern for stroke given no focal deficits on exam -Continue clonidine, amlodipine  Erythrocytosis Check erythropoietin level - as an outpatient. Patient has had chronic erythropoesis based on record review.  Hyperlipidemia-continue statin      Subjective: Patient altered this morning and does not appear to be at baseline.  She is only oriented to self.  Will pursue further encephalopathic workup including chest x-ray and MRI  Physical Exam: Vitals:   11/02/22 0400 11/02/22 0600 11/02/22 0727 11/02/22 0800  BP: (!) 149/62 (!) 156/65 (!) 172/68 (!) 164/74  Pulse: 67 62 (!) 58 65  Resp: 18 15 15 20   Temp:   97.7 F (36.5 C)   TempSrc:   Oral   SpO2: 97% 95% 97% 97%   Weight:      Height:        Disposition: Status is: Inpatient Remains inpatient appropriate because: Patient is acutely encephalopathic requiring further workup as to etiology  Planned Discharge Destination: Home    Time spent: 55 minutes  Author: Emilee Hero, MD 11/02/2022 8:40 AM  For on call review [email or web address].

## 2022-11-02 NOTE — Progress Notes (Signed)
Pt had small episode of emesis.  MD notified.  Zofran ordered.

## 2022-11-03 DIAGNOSIS — I160 Hypertensive urgency: Secondary | ICD-10-CM | POA: Diagnosis not present

## 2022-11-03 MED ORDER — LISINOPRIL 5 MG PO TABS
5.0000 mg | ORAL_TABLET | Freq: Every day | ORAL | Status: DC
  Administered 2022-11-03 – 2022-11-04 (×2): 5 mg via ORAL
  Filled 2022-11-03 (×2): qty 1

## 2022-11-03 NOTE — Progress Notes (Signed)
  Progress Note   Patient: Linda Lynch [id number] DOB: 1956/04/22 DOA: 11/01/2022     2 DOS: the patient was seen and examined on 11/03/2022   Brief hospital course: KELLE BROWDER is a 67 y.o. female with medical history significant of prior hypertension as well as prior documented history of hypertensive urgency is in the chart.  Patient reports that she has been taking her medications as prescribed as well as not having any symptoms such as chest pain shortness of breath blurry vision etc.  However patient had a visit from an RN today.  It is not entirely clear why the RN visited I suspect it was a home Doctor, general practice.  Routine vitals revealed that the patient was markedly hypertensive at home.  Patient was transferred to [facility] ER.   Assessment and Plan:  # Acute encephalopathy most likely due to poorly controlled hypertension, Hypertensive urgency, POA, active - Patient initially only oriented to self and is unable to specify why she is in the hospital where she is.  Her blood pressure is better controlled this morning and her mental status improved. MR was negative -Continue clonidine, amlodipine -start lisinopril 5mg  -If mental status continue to improve tomorrow can likely be discharged  Erythrocytosis Check erythropoietin level - as an outpatient. Patient has had chronic erythropoesis based on record review.  Hyperlipidemia-continue statin      Subjective: Mental status improved this morning with better BP control. AAOX3  Physical Exam: Vitals:   11/03/22 0350 11/03/22 0817 11/03/22 0945 11/03/22 1154  BP: (!) 171/93 (!) 154/60 (!) 146/62 (!) 156/50  Pulse: (!) 44 68 71 72  Resp: 17 15  18   Temp: 97.9 F (36.6 C) 98 F (36.7 C)  97.7 F (36.5 C)  TempSrc: Oral Oral  Oral  SpO2: 100% 97% 94% 97%  Weight:      Height:       Physical Exam Constitutional:      Appearance: She is normal weight.  HENT:     Head: Normocephalic.     Nose: Nose normal.     Mouth/Throat:      Mouth: Mucous membranes are moist.     Pharynx: Oropharynx is clear.  Eyes:     Pupils: Pupils are equal, round, and reactive to light.  Cardiovascular:     Rate and Rhythm: Normal rate and regular rhythm.     Pulses: Normal pulses.     Heart sounds: Normal heart sounds.  Pulmonary:     Effort: Pulmonary effort is normal.     Breath sounds: Normal breath sounds.  Abdominal:     General: Abdomen is flat. Bowel sounds are normal.  Musculoskeletal:        General: Normal range of motion.     Cervical back: Normal range of motion.  Skin:    General: Skin is warm.     Capillary Refill: Capillary refill takes less than 2 seconds.  Neurological:     General: No focal deficit present.     Mental Status: She is alert. Mental status is at baseline.      Disposition: Status is: Inpatient Remains inpatient appropriate because: Patient is acutely encephalopathic requiring further workup as to etiology  Planned Discharge Destination: Home    Time spent: 55 minutes  Author: Emilee Hero, MD 11/03/2022 12:44 PM  For on call review [email or web address].

## 2022-11-03 NOTE — Progress Notes (Signed)
Catapres transdermal patch not on Pt this morning upon assessment.  Pt has no memory of removing it.  No report given about it falling off ovenight  New patch ordered from pharmacy.

## 2022-11-04 ENCOUNTER — Other Ambulatory Visit (HOSPITAL_COMMUNITY): Payer: Self-pay

## 2022-11-04 DIAGNOSIS — I160 Hypertensive urgency: Secondary | ICD-10-CM | POA: Diagnosis not present

## 2022-11-04 MED ORDER — CLONIDINE HCL 0.2 MG PO TABS
0.2000 mg | ORAL_TABLET | Freq: Two times a day (BID) | ORAL | 0 refills | Status: AC
  Filled 2022-11-04: qty 60, 30d supply, fill #0

## 2022-11-04 MED ORDER — AMLODIPINE BESYLATE 10 MG PO TABS
10.0000 mg | ORAL_TABLET | Freq: Every day | ORAL | 0 refills | Status: AC
  Filled 2022-11-04: qty 30, 30d supply, fill #0

## 2022-11-04 MED ORDER — ATORVASTATIN CALCIUM 20 MG PO TABS
20.0000 mg | ORAL_TABLET | Freq: Every day | ORAL | 0 refills | Status: AC
  Filled 2022-11-04: qty 30, 30d supply, fill #0

## 2022-11-04 MED ORDER — ASPIRIN 81 MG PO TBEC
81.0000 mg | DELAYED_RELEASE_TABLET | Freq: Every day | ORAL | 0 refills | Status: AC
  Filled 2022-11-04: qty 30, 30d supply, fill #0

## 2022-11-04 MED ORDER — CLONIDINE 0.3 MG/24HR TD PTWK
0.3000 mg | MEDICATED_PATCH | TRANSDERMAL | 0 refills | Status: DC
  Filled 2022-11-04: qty 4, 28d supply, fill #0

## 2022-11-04 MED ORDER — LISINOPRIL 10 MG PO TABS
10.0000 mg | ORAL_TABLET | Freq: Every day | ORAL | 0 refills | Status: AC
  Filled 2022-11-04: qty 30, 30d supply, fill #0

## 2022-11-04 NOTE — Discharge Summary (Signed)
Physician Discharge Summary   Patient: Linda Lynch MRN: [id number] DOB: 03-Dec-1955  Admit date:     11/01/2022  Discharge date: 11/04/22  Discharge Physician: Patrecia Pour   PCP: Patient, No Pcp Per   Recommendations at discharge:  Follow up with PCP in the next 1-2 weeks for repeat BP and BMP monitoring.   Discharge Diagnoses: Principal Problem:   Hypertensive urgency Active Problems:   Erythrocytosis   Troponin I above reference range  Hospital Course: Linda Lynch is a 67 y.o. female with a history of HTN and episodes of hypertensive urgency who was sent to the ED 3/29 by home health RN who noted severely elevated blood pressure and confusion. She was severely hypertensive, improved with titration of medications with resolution of encephalopathy. She has remained stable on current medications and will be discharged with plans for PCP follow up.   Assessment and Plan: Hypertensive urgency and chronically uncontrolled HTN: BP severely elevated on arrival and patient only oriented to self. BP has been corrected at goal rate. TSH normal. No cardiomegaly on CXR. - Continue clonidine patch (initiated while admitted with hopes of improving patient adherence), norvasc, and lisinopril (ECG here and last echo w/LVH).  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  - Follow up plasma metanephrines and hyperaldosteronism labs, pending at discharge.  - HR borderline bradycardic, will not institute beta blocker, no known CAD. - Home health RN ordered at discharge.  Acute encephalopathy: Due to HTN urgency. MRI revealed extensive small vessel ischemic changes, many with hemosiderin deposition consistent with hypertensive etiology. No acute findings.  - AMS has resolved.   Demand myocardial ischemia: No new ischemic ECG changes (RBBB preexisting from at least 2019), no chest pain, troponin very minimally elevated at  18 (ULN is 17 for this assay). Expected elevation with LVH in setting of HTN urgency.  - Defer ischemic evaluation to PCP/cardiology as an outpatient.  HLD: Appears to have been prescribed statin in the past which we will continue along with aspirin, given severity of cerebrovascular disease and known aortic atherosclerosis.  Erythrocytosis: Resolved.  Consultants: None Procedures performed: None  Disposition: Home Diet recommendation:  Cardiac diet DISCHARGE MEDICATION: Allergies as of 11/04/2022   No Known Allergies      Medication List     STOP taking these medications    diltiazem 300 MG 24 hr capsule Commonly known as: CARDIZEM CD   metoprolol tartrate 25 MG tablet Commonly known as: LOPRESSOR       TAKE these medications    amLODipine 10 MG tablet Commonly known as: NORVASC Take 1 tablet (10 mg total) by mouth daily.   aspirin EC 81 MG tablet Take 1 tablet (81 mg total) by mouth daily.   atorvastatin 20 MG tablet Commonly known as: LIPITOR Take 1 tablet (20 mg total) by mouth daily at 6 PM.   cloNIDine 0.3 mg/24hr patch Commonly known as: CATAPRES - Dosed in mg/24 hr Place 1 patch (0.3 mg total) onto the skin once a week. Start taking on: November 08, 2022   lisinopril 10 MG tablet Commonly known as: ZESTRIL Take 1 tablet (10 mg total) by mouth daily. Start taking on: November 05, 2022   Multivitamin Gummies Adult Diona Fanti 2 each by mouth daily.   multivitamin tablet Take 2 tablets by mouth daily.        Follow-up Information     Clovia Cuff, MD Follow up.  Specialty: Internal Medicine Contact information: [address] [place] [address] [phone]                Discharge Exam: Danley Danker Weights   11/01/22 1338 11/01/22 2100  Weight: 68 kg 67.4 kg  BP (!) 141/52 (BP Location: Left Arm)   Pulse (!) 53   Temp (!) 97.5 F (36.4 C) (Oral)   Resp 20   Ht 5\' 4"  (1.626 m)   Wt 67.4 kg   SpO2 100%   BMI 25.49 kg/m   No  distress, sitting at EOB eating breakfast without complaint. RRR, no MRG or pitting edema Clear, nonlabored.  +BS, soft, NT, ND  Condition at discharge: stable  The results of significant diagnostics from this hospitalization (including imaging, microbiology, ancillary and laboratory) are listed below for reference.   Imaging Studies: MR BRAIN WO CONTRAST  Result Date: 11/02/2022 CLINICAL DATA:  Altered mental status.  Headache and hypertension. EXAM: MRI HEAD WITHOUT CONTRAST TECHNIQUE: Multiplanar, multiecho pulse sequences of the brain and surrounding structures were obtained without intravenous contrast. COMPARISON:  Head CT yesterday.  Brain MRI 05/18/2018 FINDINGS: Brain: Diffusion imaging does not show any acute or subacute infarction. There are old small vessel infarctions affecting the pons. 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 There are old small vessel infarctions in both thalami. Old small vessel basal ganglia infarctions are present. Extensive chronic small-vessel ischemic changes present throughout the white matter. No large vessel territory stroke. There are foci of hemosiderin deposition associated with some of the old small vessel infarctions. No evidence of recent hemorrhage. No mass lesion, hydrocephalus or extra-axial collection. Vascular: Major vessels at the base of the brain show flow. Skull and upper cervical spine: Negative Sinuses/Orbits: Clear/normal Other: None IMPRESSION: 1. No acute or reversible finding. Extensive chronic small-vessel ischemic changes throughout the brain as outlined above. Many are associated with hemosiderin deposition, consistent with the diagnosis of hypertensive small-vessel disease. 2. 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 Electronically Signed   By: Nelson Chimes M.D.   On: 11/02/2022 17:18   DG Abd 1 View  Result  Date: 11/02/2022 CLINICAL DATA:  Evaluate for metal implant prior to MRI. EXAM: ABDOMEN - 1 VIEW COMPARISON:  None Available. FINDINGS: Supine and upright views. The upright view of the abdomen and upper pelvis demonstrates no free intraperitoneal air or significant air-fluid levels. The supine views demonstrate a nonobstructive bowel-gas pattern. No metallic objects identified overlying the abdomen or pelvis. There is a curvilinear density of 4 mm which projects over the lower thoracic spine, minimally eccentric left and is indeterminate in location and etiology. IMPRESSION: Circular 4 mm curvilinear density projecting over the lower thoracic spine, indeterminate location in etiology. Otherwise, no unexpected metallic objects identified. Electronically Signed   By: Abigail Miyamoto M.D.   On: 11/02/2022 15:18   DG CHEST PORT 1 VIEW  Result Date: 11/02/2022 CLINICAL DATA:  Shortness of breath EXAM: PORTABLE CHEST - 1 VIEW COMPARISON:  05/18/2018 FINDINGS: Lungs are clear. Heart size and mediastinal contours are within normal limits. Aortic Atherosclerosis (ICD10-170.0). No effusion. Visualized bones unremarkable. IMPRESSION: No acute cardiopulmonary disease. Electronically Signed   By: Lucrezia Europe M.D.   On: 11/02/2022 13:13   CT Head Wo Contrast  Result Date: 11/01/2022 CLINICAL DATA:  Headache and hypertension EXAM: CT HEAD WITHOUT CONTRAST TECHNIQUE: Contiguous axial images were obtained from the base of  the skull through the vertex without intravenous contrast. RADIATION DOSE REDUCTION: This exam was performed according to the departmental dose-optimization program which includes automated exposure control, adjustment of the mA and/or kV according to patient size and/or use of iterative reconstruction technique. COMPARISON:  Brain MRI 05/18/2018 FINDINGS: Brain: There is no acute intracranial hemorrhage, extra-axial fluid collection, or acute infarct. There is mild and infratentorial volume loss with  prominence of the ventricular system and extra-axial CSF spaces, similar to the prior brain MRI. There are remote infarcts in the bilateral basal ganglia and thalami. Additional confluent hypodensity in the supratentorial white matter likely reflects sequela of chronic small-vessel ischemic change. 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 There is no other mass lesion. There is no mass effect or midline shift. Vascular: No hyperdense vessel or unexpected calcification. Skull: Normal. Negative for fracture or focal lesion. Sinuses/Orbits: The imaged paranasal sinuses are clear. The globes and orbits are unremarkable. Other: None. IMPRESSION: No acute intracranial pathology. Electronically Signed   By: Valetta Mole M.D.   On: 11/01/2022 15:59    Microbiology: Results for orders placed or performed during the hospital encounter of 05/18/18  MRSA PCR Screening     Status: None   Collection Time: 05/19/18  6:07 AM   Specimen: Nasal Mucosa; Nasopharyngeal  Result Value Ref Range Status   MRSA by PCR NEGATIVE NEGATIVE Final    Comment:        The GeneXpert MRSA Assay (FDA approved for NASAL specimens only), is one component of a comprehensive MRSA colonization surveillance program. It is not intended to diagnose MRSA infection nor to guide or monitor treatment for MRSA infections. Performed at [facility], [place] [address], [place], [place] [address]     Labs: CBC: Recent Labs  Lab 11/01/22 1351 11/01/22 2136 11/02/22 0114  WBC 9.2 14.3* 11.2*  HGB 15.3* 14.1 13.7  HCT 46.4* 42.0 39.3  MCV 91.9 90.3 87.7  PLT 317 293 [id number]   Basic Metabolic Panel: Recent Labs  Lab 11/01/22 1351 11/01/22 2136 11/02/22 0114  NA 141  --  139  K 3.6  --  3.4*  CL 100  --  104  CO2 27  --  27  GLUCOSE 87  --  118*  BUN 12  --  15  CREATININE 1.08* 1.13* 1.15*  CALCIUM 9.9  --  9.1    Liver Function Tests: No results for input(s): "AST", "ALT", "ALKPHOS", "BILITOT", "PROT", "ALBUMIN" in the last 168 hours. CBG: No results for input(s): "GLUCAP" in the last 168 hours.  Discharge time spent: greater than 30 minutes.  Signed: Patrecia Pour, MD [facility] 11/04/2022

## 2022-11-04 NOTE — TOC Transition Note (Addendum)
Transition of Care (TOC) - CM/SW Discharge Note Marvetta Gibbons RN, BSN Transitions of Care Unit 4E- RN Case Manager See Treatment Team for direct phone #   Patient Details  Name: Linda Lynch MRN: [id number] Date of Birth: 04-08-1956  Transition of Care [facility]) CM/SW Contact:  Dawayne Patricia, RN Phone Number: 11/04/2022, 12:06 PM   Clinical Narrative:    Pt stable for transition home today, brother to transport  home.  Order placed for [facility].  CM in to speak with pt at bedside. Per pt she had a nurse coming to the home prior to admit- but can not remember name of agency- per review in [facility]- pt showing as active with [facility]. CM will reach out to liaison and confirm [place] status and services.   Address, phone # and PCP ([facility]) all confirmed with pt.   No DME needs noted.   Call made to [facility] left- awaiting return call-  Msg also left with [facility] main office.  1225- received return call from [place]- confirmed pt had HHRN/PT/aide services ordered by her PCP- modified HH orders to include PT/aide as well as previously ordered.    Final next level of care: Home w Home Health Services Barriers to Discharge: No Barriers Identified   Patient Goals and CMS Choice CMS Medicare.gov Compare Post Acute Care list provided to:: Patient Choice offered to / list presented to : Patient  Discharge Placement                   Home w/ [facility]      Discharge Plan and Services Additional resources added to the After Visit Summary for     Discharge Planning Services: CM Consult Post Acute Care Choice: Home Health, Resumption of Svcs/PTA Provider          DME Arranged: N/A DME Agency: NA       HH Arranged: RN, Disease Management [place] Agency: [facility] Date [place]: 11/04/22 Time [place]: 1205 Representative spoke with at [place]: [place] Determinants of Health ([place]) Interventions SDOH Screenings    Tobacco Use: Unknown (11/01/2022)     Readmission Risk Interventions    11/04/2022   12:06 PM  Readmission Risk Prevention Plan  Post Dischage Appt Complete  Medication Screening Complete  Transportation Screening Complete

## 2022-11-04 NOTE — Care Management Important Message (Signed)
Important Message  Patient Details  Name: Linda Lynch MRN: [id number] Date of Birth: 13-Sep-1955   Medicare Important Message Given:  Yes     Shelda Altes 11/04/2022, 8:51 AM

## 2022-11-12 LAB — ALDOSTERONE + RENIN ACTIVITY W/ RATIO
ALDO / PRA Ratio: 4.1 (ref 0.0–30.0)
Aldosterone: 10.4 ng/dL (ref 0.0–30.0)
PRA LC/MS/MS: 2.542 ng/mL/hr (ref 0.167–5.380)

## 2022-11-12 LAB — METANEPHRINES, PLASMA
Metanephrine, Free: <25 pg/mL (ref 0.0–88.0)
Normetanephrine, Free: 138.9 pg/mL (ref 0.0–285.2)

## 2024-01-27 ENCOUNTER — Other Ambulatory Visit: Payer: Self-pay | Admitting: Nurse Practitioner

## 2024-01-27 DIAGNOSIS — Z1231 Encounter for screening mammogram for malignant neoplasm of breast: Secondary | ICD-10-CM

## 2024-03-12 ENCOUNTER — Other Ambulatory Visit (HOSPITAL_COMMUNITY): Payer: Self-pay | Admitting: Physician Assistant

## 2024-03-12 DIAGNOSIS — I4891 Unspecified atrial fibrillation: Secondary | ICD-10-CM

## 2024-04-20 ENCOUNTER — Ambulatory Visit (HOSPITAL_COMMUNITY)
Admission: RE | Admit: 2024-04-20 | Discharge: 2024-04-20 | Disposition: A | Discharge status: Home / Self Care | Source: Ambulatory Visit | Attending: Cardiovascular Disease | Admitting: Cardiovascular Disease

## 2024-04-20 DIAGNOSIS — I4891 Unspecified atrial fibrillation: Secondary | ICD-10-CM | POA: Diagnosis not present

## 2024-04-20 LAB — ECHOCARDIOGRAM COMPLETE
AR max vel: 3.52 cm2
AV Area VTI: 3.45 cm2
AV Area mean vel: 3.39 cm2
AV Mean grad: 3 mmHg
AV Peak grad: 5.3 mmHg
Ao pk vel: 1.16 m/s
Area-P 1/2: 2.87 cm2
S' Lateral: 2.2 cm

## 2024-06-07 ENCOUNTER — Other Ambulatory Visit: Payer: Self-pay | Admitting: Primary Care

## 2024-06-07 DIAGNOSIS — Z1231 Encounter for screening mammogram for malignant neoplasm of breast: Secondary | ICD-10-CM

## 2024-07-06 ENCOUNTER — Encounter (HOSPITAL_BASED_OUTPATIENT_CLINIC_OR_DEPARTMENT_OTHER): Payer: Self-pay | Admitting: Student

## 2024-07-06 DIAGNOSIS — Z1231 Encounter for screening mammogram for malignant neoplasm of breast: Secondary | ICD-10-CM
